# Patient Record
Sex: Male | Born: 1980 | Race: White | Hispanic: No | Marital: Married | Smoking: Never smoker
Health system: Southern US, Community
[De-identification: ages and names within clinical notes are randomized; demographics above are authoritative.]

## PROBLEM LIST (undated history)

## (undated) DIAGNOSIS — N200 Calculus of kidney: Secondary | ICD-10-CM

## (undated) DIAGNOSIS — K219 Gastro-esophageal reflux disease without esophagitis: Secondary | ICD-10-CM

## (undated) DIAGNOSIS — R55 Syncope and collapse: Secondary | ICD-10-CM

## (undated) DIAGNOSIS — M7580 Other shoulder lesions, unspecified shoulder: Secondary | ICD-10-CM

## (undated) HISTORY — DX: Other shoulder lesions, unspecified shoulder: M75.80

## (undated) HISTORY — DX: Syncope and collapse: R55

## (undated) HISTORY — DX: Calculus of kidney: N20.0

---
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
## 2001-03-29 ENCOUNTER — Encounter: Payer: Self-pay | Admitting: *Deleted

## 2001-03-29 ENCOUNTER — Observation Stay (HOSPITAL_COMMUNITY): Admission: EM | Admit: 2001-03-29 | Discharge: 2001-03-30 | Payer: Self-pay | Admitting: *Deleted

## 2006-12-30 ENCOUNTER — Emergency Department (HOSPITAL_COMMUNITY): Admission: EM | Admit: 2006-12-30 | Discharge: 2006-12-30 | Payer: Self-pay | Admitting: Emergency Medicine

## 2012-04-17 ENCOUNTER — Emergency Department (HOSPITAL_COMMUNITY): Payer: BC Managed Care – PPO

## 2012-04-17 ENCOUNTER — Emergency Department (HOSPITAL_COMMUNITY)
Admission: EM | Admit: 2012-04-17 | Discharge: 2012-04-17 | Disposition: A | Discharge status: Home / Self Care | Payer: BC Managed Care – PPO | Attending: Emergency Medicine | Admitting: Emergency Medicine

## 2012-04-17 ENCOUNTER — Encounter (HOSPITAL_COMMUNITY): Payer: Self-pay | Admitting: Emergency Medicine

## 2012-04-17 DIAGNOSIS — R1110 Vomiting, unspecified: Secondary | ICD-10-CM | POA: Insufficient documentation

## 2012-04-17 DIAGNOSIS — R109 Unspecified abdominal pain: Secondary | ICD-10-CM | POA: Insufficient documentation

## 2012-04-17 DIAGNOSIS — Z79899 Other long term (current) drug therapy: Secondary | ICD-10-CM | POA: Insufficient documentation

## 2012-04-17 DIAGNOSIS — K219 Gastro-esophageal reflux disease without esophagitis: Secondary | ICD-10-CM | POA: Insufficient documentation

## 2012-04-17 DIAGNOSIS — N23 Unspecified renal colic: Secondary | ICD-10-CM

## 2012-04-17 DIAGNOSIS — N1330 Unspecified hydronephrosis: Secondary | ICD-10-CM | POA: Insufficient documentation

## 2012-04-17 DIAGNOSIS — R197 Diarrhea, unspecified: Secondary | ICD-10-CM | POA: Insufficient documentation

## 2012-04-17 DIAGNOSIS — N201 Calculus of ureter: Secondary | ICD-10-CM | POA: Insufficient documentation

## 2012-04-17 HISTORY — DX: Gastro-esophageal reflux disease without esophagitis: K21.9

## 2012-04-17 LAB — COMPREHENSIVE METABOLIC PANEL
ALT: 18 U/L (ref 0–53)
AST: 22 U/L (ref 0–37)
Alkaline Phosphatase: 98 U/L (ref 39–117)
Calcium: 9.5 mg/dL (ref 8.4–10.5)
Potassium: 3.6 mEq/L (ref 3.5–5.1)
Sodium: 137 mEq/L (ref 135–145)
Total Protein: 7.2 g/dL (ref 6.0–8.3)

## 2012-04-17 LAB — URINALYSIS, ROUTINE W REFLEX MICROSCOPIC
Bilirubin Urine: NEGATIVE
Glucose, UA: NEGATIVE mg/dL
Nitrite: NEGATIVE
Protein, ur: NEGATIVE mg/dL
Specific Gravity, Urine: 1.026 (ref 1.005–1.030)
Urobilinogen, UA: 0.2 mg/dL (ref 0.0–1.0)
pH: 7.5 (ref 5.0–8.0)

## 2012-04-17 LAB — CBC
MCH: 30.4 pg (ref 26.0–34.0)
MCV: 85 fL (ref 78.0–100.0)
Platelets: 168 10*3/uL (ref 150–400)
RBC: 5 MIL/uL (ref 4.22–5.81)
RDW: 12.5 % (ref 11.5–15.5)
WBC: 5 10*3/uL (ref 4.0–10.5)

## 2012-04-17 LAB — DIFFERENTIAL
Basophils Absolute: 0 10*3/uL (ref 0.0–0.1)
Eosinophils Absolute: 0.1 10*3/uL (ref 0.0–0.7)
Eosinophils Relative: 2 % (ref 0–5)
Lymphocytes Relative: 30 % (ref 12–46)
Neutrophils Relative %: 62 % (ref 43–77)

## 2012-04-17 LAB — URINE MICROSCOPIC-ADD ON

## 2012-04-17 MED ORDER — ONDANSETRON HCL 4 MG PO TABS: 4.0000 mg | ORAL_TABLET | Freq: Four times a day (QID) | ORAL | Status: AC

## 2012-04-17 MED ORDER — SODIUM CHLORIDE 0.9 % IV BOLUS (SEPSIS)
1000.0000 mL | Freq: Once | INTRAVENOUS | Status: AC
  Administered 2012-04-17: 1000 mL via INTRAVENOUS

## 2012-04-17 MED ORDER — OXYCODONE-ACETAMINOPHEN 5-325 MG PO TABS: 1.0000 | ORAL_TABLET | ORAL | Status: AC | PRN

## 2012-04-17 MED ORDER — HYDROMORPHONE HCL PF 1 MG/ML IJ SOLN
1.0000 mg | Freq: Once | INTRAMUSCULAR | Status: AC
  Administered 2012-04-17: 1 mg via INTRAVENOUS
  Filled 2012-04-17: qty 1

## 2012-04-17 MED ORDER — ONDANSETRON HCL 4 MG/2ML IJ SOLN
4.0000 mg | Freq: Once | INTRAMUSCULAR | Status: AC
  Administered 2012-04-17: 4 mg via INTRAVENOUS
  Filled 2012-04-17: qty 2

## 2012-04-17 MED ORDER — CIPROFLOXACIN HCL 500 MG PO TABS: 500.0000 mg | ORAL_TABLET | Freq: Once | ORAL | Status: DC

## 2012-04-17 MED ORDER — CIPROFLOXACIN HCL 500 MG PO TABS: 500.0000 mg | ORAL_TABLET | Freq: Two times a day (BID) | ORAL | Status: AC

## 2012-04-17 NOTE — ED Provider Notes (Signed)
History     CSN: [id number]  Arrival date & time 04/17/12  1321   First MD Initiated Contact with Patient 04/17/12 1344      Chief Complaint  Patient presents with  . Flank Pain    (Consider location/radiation/quality/duration/timing/severity/associated sxs/prior treatment) HPI Patient complaining of pain rlq began about 1000 am today with sudden onset sharp pain with some radiation lower.  Pain is severe and constant.  Three episodes of loose stool and vomited x 3-4.  Some difficulty urinating, no pain or frequency.  No prior history of same.  Past Medical History  Diagnosis Date  . GERD (gastroesophageal reflux disease)     No past surgical history on file.  No family history on file.  History  Substance Use Topics  . Smoking status: Not on file  . Smokeless tobacco: Not on file  . Alcohol Use:       Review of Systems  All other systems reviewed and are negative.    Allergies  Review of patient's allergies indicates no known allergies.  Home Medications   Current Outpatient Rx  Name Route Sig Dispense Refill  . MELOXICAM 7.5 MG PO TABS Oral Take 7.5 mg by mouth daily.    Marland Kitchen OMEPRAZOLE 20 MG PO CPDR Oral Take 20 mg by mouth daily.      There were no vitals taken for this visit.  Physical Exam  Nursing note and vitals reviewed. Constitutional: He is oriented to person, place, and time. He appears well-developed and well-nourished.  HENT:  Head: Normocephalic and atraumatic.  Right Ear: External ear normal.  Left Ear: External ear normal.  Nose: Nose normal.  Mouth/Throat: Oropharynx is clear and moist.  Eyes: Conjunctivae and EOM are normal. Pupils are equal, round, and reactive to light.  Neck: Normal range of motion. Neck supple.  Cardiovascular: Normal rate, regular rhythm, normal heart sounds and intact distal pulses.   Pulmonary/Chest: Effort normal and breath sounds normal.  Abdominal: Soft. Bowel sounds are normal.  Musculoskeletal: Normal  range of motion.  Neurological: He is alert and oriented to person, place, and time. He has normal reflexes.  Skin: Skin is warm and dry.  Psychiatric: He has a normal mood and affect. His behavior is normal. Thought content normal.    ED Course  Procedures (including critical care time)  Labs Reviewed - No data to display No results found.   No diagnosis found.   Results for orders placed during the hospital encounter of 04/17/12  URINALYSIS, ROUTINE W REFLEX MICROSCOPIC      Component Value Range   Color, Urine YELLOW  YELLOW    APPearance CLEAR  CLEAR    Specific Gravity, Urine 1.026  1.005 - 1.030    pH 7.5  5.0 - 8.0    Glucose, UA NEGATIVE  NEGATIVE (mg/dL)   Hgb urine dipstick MODERATE (*) NEGATIVE    Bilirubin Urine NEGATIVE  NEGATIVE    Ketones, ur TRACE (*) NEGATIVE (mg/dL)   Protein, ur NEGATIVE  NEGATIVE (mg/dL)   Urobilinogen, UA 0.2  0.0 - 1.0 (mg/dL)   Nitrite NEGATIVE  NEGATIVE    Leukocytes, UA TRACE (*) NEGATIVE   CBC      Component Value Range   WBC 5.0  4.0 - 10.5 (K/uL)   RBC 5.00  4.22 - 5.81 (MIL/uL)   Hemoglobin 15.2  13.0 - 17.0 (g/dL)   HCT 40.9  81.1 - 91.4 (%)   MCV 85.0  78.0 - 100.0 (fL)   MCH  30.4  26.0 - 34.0 (pg)   MCHC 35.8  30.0 - 36.0 (g/dL)   RDW 16.1  09.6 - 04.5 (%)   Platelets 168  150 - 400 (K/uL)  DIFFERENTIAL      Component Value Range   Neutrophils Relative 62  43 - 77 (%)   Neutro Abs 3.1  1.7 - 7.7 (K/uL)   Lymphocytes Relative 30  12 - 46 (%)   Lymphs Abs 1.5  0.7 - 4.0 (K/uL)   Monocytes Relative 7  3 - 12 (%)   Monocytes Absolute 0.3  0.1 - 1.0 (K/uL)   Eosinophils Relative 2  0 - 5 (%)   Eosinophils Absolute 0.1  0.0 - 0.7 (K/uL)   Basophils Relative 0  0 - 1 (%)   Basophils Absolute 0.0  0.0 - 0.1 (K/uL)  COMPREHENSIVE METABOLIC PANEL      Component Value Range   Sodium 137  135 - 145 (mEq/L)   Potassium 3.6  3.5 - 5.1 (mEq/L)   Chloride 102  96 - 112 (mEq/L)   CO2 22  19 - 32 (mEq/L)   Glucose, Bld 140 (*)  70 - 99 (mg/dL)   BUN 21  6 - 23 (mg/dL)   Creatinine, Ser 4.09 (*) 0.50 - 1.35 (mg/dL)   Calcium 9.5  8.4 - 81.1 (mg/dL)   Total Protein 7.2  6.0 - 8.3 (g/dL)   Albumin 4.5  3.5 - 5.2 (g/dL)   AST 22  0 - 37 (U/L)   ALT 18  0 - 53 (U/L)   Alkaline Phosphatase 98  39 - 117 (U/L)   Total Bilirubin 0.7  0.3 - 1.2 (mg/dL)   GFR calc non Af Amer 64 (*) >90 (mL/min)   GFR calc Af Amer 74 (*) >90 (mL/min)  URINE MICROSCOPIC-ADD ON      Component Value Range   Squamous Epithelial / LPF FEW (*) RARE    WBC, UA 0-2  <3 (WBC/hpf)   RBC / HPF 7-10  <3 (RBC/hpf)   Bacteria, UA FEW (*) RARE    Ct Abdomen Pelvis Wo Contrast  04/17/2012  *RADIOLOGY REPORT*  Clinical Data: Right flank and right lower quadrant pain with nausea and diarrhea.  CT ABDOMEN AND PELVIS WITHOUT CONTRAST  Technique:  Multidetector CT imaging of the abdomen and pelvis was performed following the standard protocol without intravenous contrast.  Comparison: None.  Findings: Lung bases are clear.  Heart size normal.  No pericardial or pleural effusion.  Liver, gallbladder and adrenal glands are unremarkable.  Mild right hydronephrosis and periaortic stranding secondary to a 2 mm stone at the right ureteral vesicle junction.  Left kidney, spleen, pancreas, stomach and bowel are unremarkable.  No pathologically enlarged lymph nodes.  No free fluid.  No worrisome lytic or sclerotic lesions.  IMPRESSION: Mild right hydronephrosis secondary to a 2 mm right ureteral vesicle junction stone.  Original Report Authenticated By: Reyes Ivan, M.D.   MDM    Patient is pain-free after 1 mg Dilaudid. Urine is to be cultured. I've advised patient regarding symptoms of his ureteral stone. He does have 0-2 white blood cells and few bacteria. He was placed on Cipro. He is advised to have recheck of his renal function his creatinine is elevated at 1.43. He is advised to orally rehydrate. He is given 1 L normal saline here.     Hilario Quarry,  MD 04/17/12 [phone]

## 2012-04-17 NOTE — ED Notes (Signed)
Discussed discharged instructions.  Patient verbalized understanding.  IV removed from RAC.  Catheter intact.  IV site WNL.  Denies pain.  Ambulatory with mother and wife at bedside.  Refused cipro from ED.  Will get Cipro filled at pharmacy and begin treatment at home.  No other concerns at this time.  Barrie Lyme 3:43 PM 04/17/2012

## 2012-04-17 NOTE — ED Notes (Signed)
Returned from CT.

## 2012-04-17 NOTE — ED Notes (Signed)
Patient transported to CT 

## 2012-04-17 NOTE — ED Notes (Signed)
Per pt, started vomiting around 10am-right flank pain-dysuria

## 2012-04-17 NOTE — Discharge Instructions (Signed)
Ureteral Colic  Ureteral colic is spasm-like pain from the kidney or the ureter. This is often caused by a kidney stone. The pain is caused by the stone trying to get through the tubes that pass your pee.  HOME CARE    Drink enough fluids to keep your pee (urine) clear or pale yellow.   Strain all your pee. A strainer will be provided. Keep anything caught in the strainer and bring it to your doctor. The stone causing the pain may be very small.   Only take medicine as told by your doctor.   Follow up with your doctor as told.  GET HELP RIGHT AWAY IF:    Pain is not controlled with medicine.   Pain continues or gets worse.   The pain changes and there is chest or belly (abdominal) pain.   You pass out (faint).   You cannot pee.   You keep throwing up (vomiting).   You have a temperature by mouth above 102 F (38.9 C), not controlled by medicine.  MAKE SURE YOU:    Understand these instructions.   Will watch this condition.   Will get help right away if you are not doing well or get worse.  Document Released: 05/01/2008 Document Revised: 11/02/2011 Document Reviewed: 05/01/2008  [employer] Patient Information 2012 [employer].

## 2012-04-18 LAB — URINE CULTURE: Culture  Setup Time: 201305230147

## 2012-06-13 IMAGING — CT CT ABD-PELV W/O CM
1 series · 16 of 25 positions shown, 20 images · non-contrast
Comparison: None.

CLINICAL DATA: Right flank and right lower quadrant pain with
nausea and diarrhea.

CT ABDOMEN AND PELVIS WITHOUT CONTRAST
TECHNIQUE: Multidetector CT imaging of the abdomen and pelvis was
performed following the standard protocol without intravenous
contrast.

[Series 4: lung · axial · 0.75mm/px · z∈[+1336,+1446]mm · 16 of 25 slices shown, 20 images]
[im 2/25  soft-tissue]
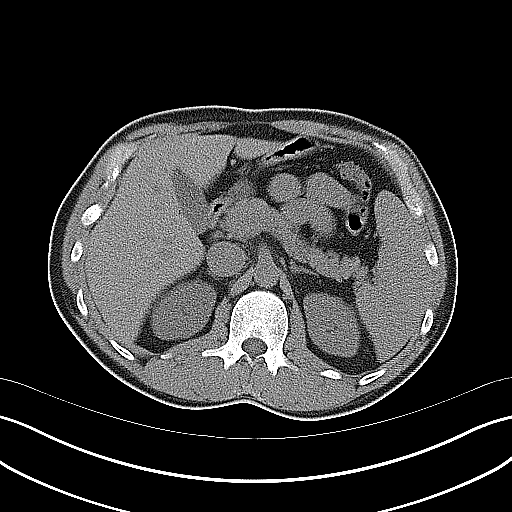
[im 2/25  bone]
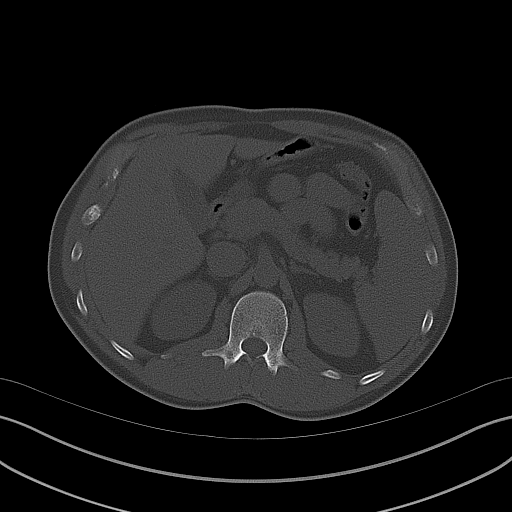
[im 4/25  soft-tissue]
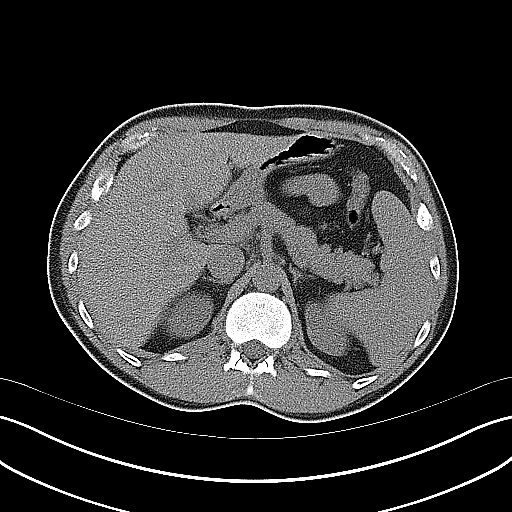
[im 6/25  soft-tissue]
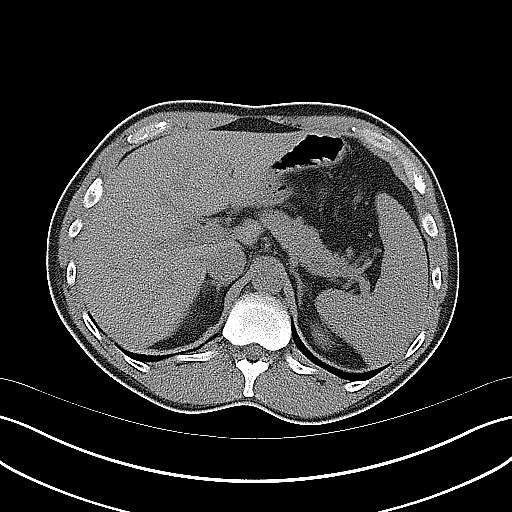
[im 7/25  soft-tissue]
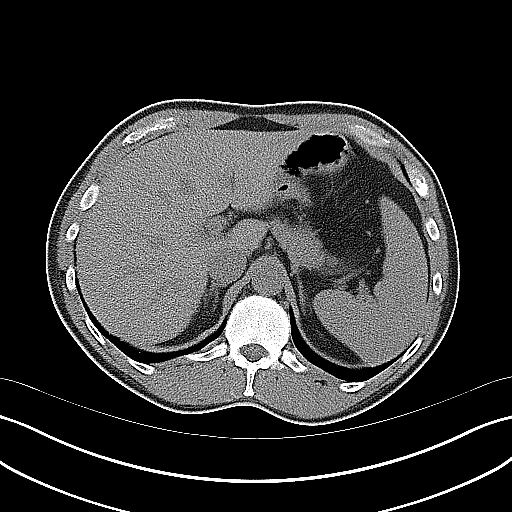
[im 9/25  soft-tissue]
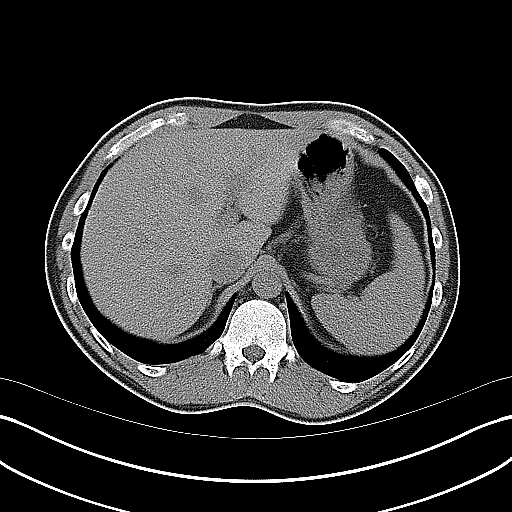
[im 11/25  soft-tissue]
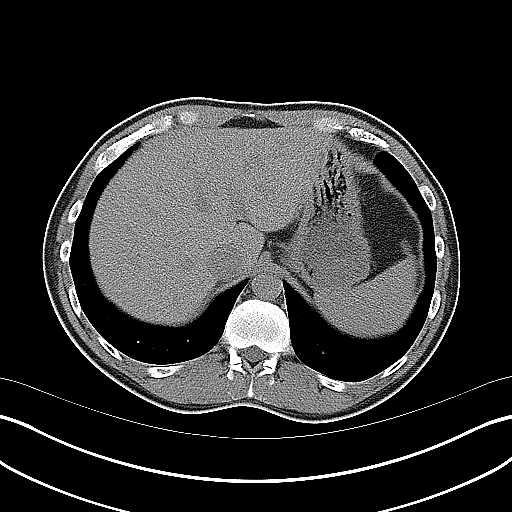
[im 12/25  soft-tissue]
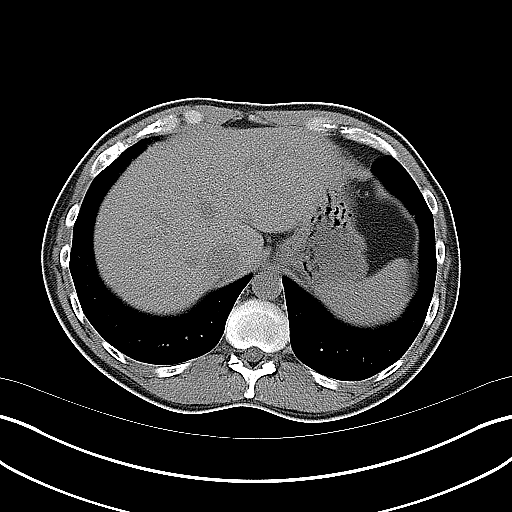
[im 14/25  soft-tissue]
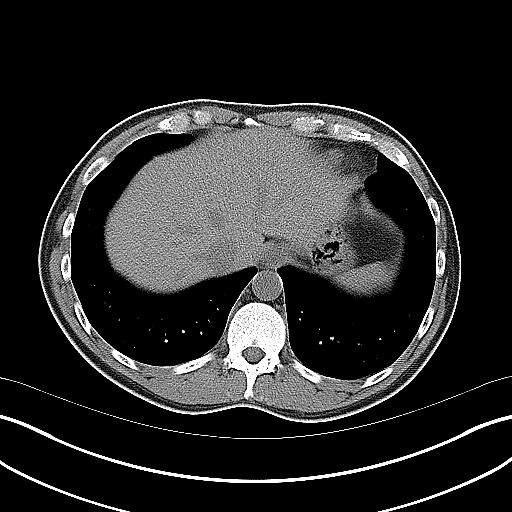
[im 15/25  soft-tissue]
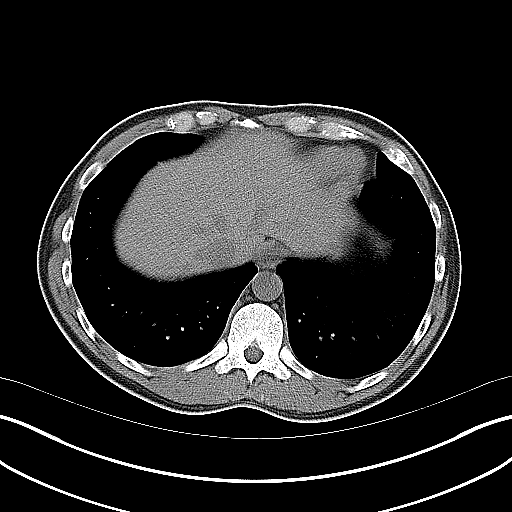
[im 15/25  bone]
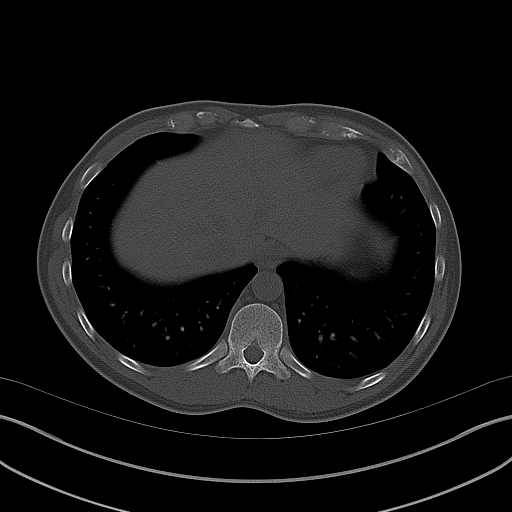
[im 17/25  soft-tissue]
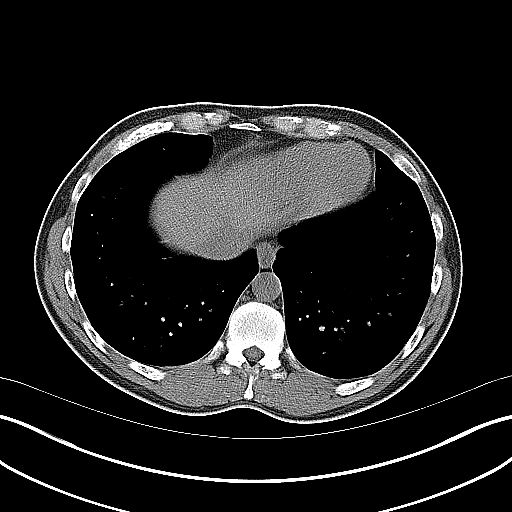
[im 19/25  soft-tissue]
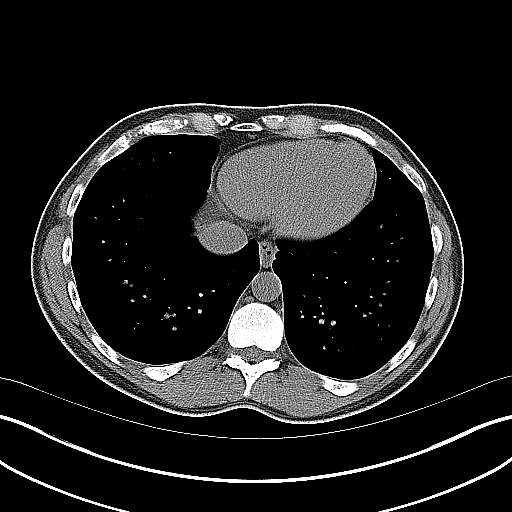
[im 20/25  soft-tissue]
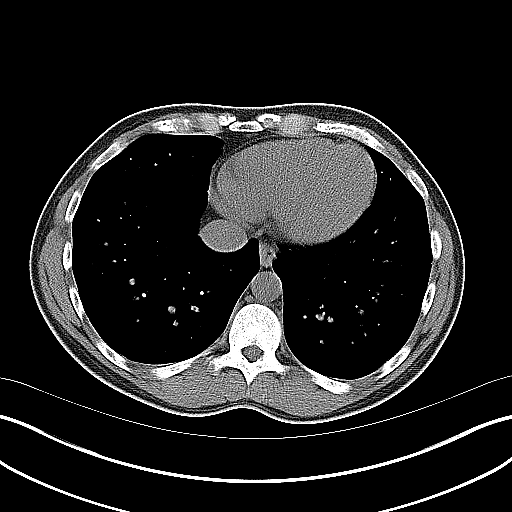
[im 21/25  lung]
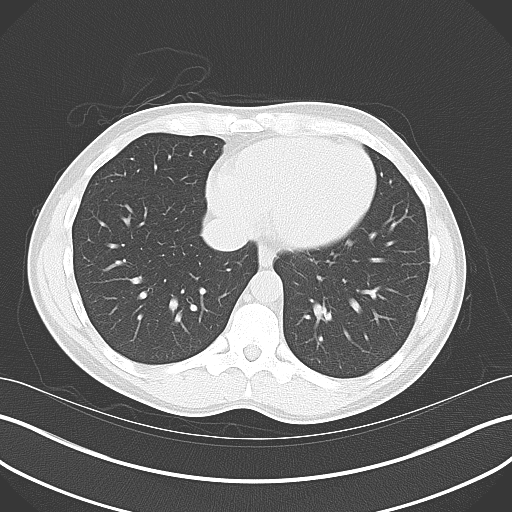
[im 22/25  soft-tissue]
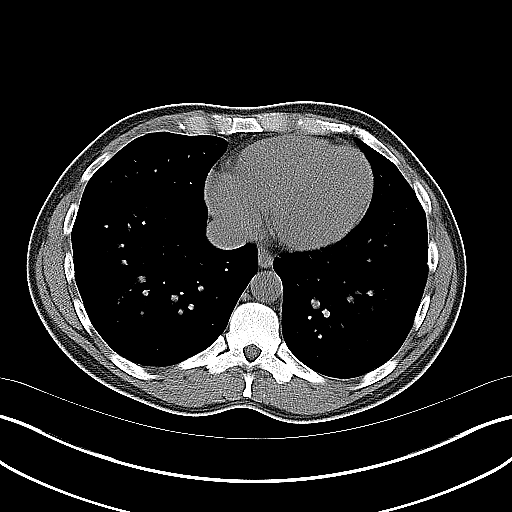
[im 22/25  lung]
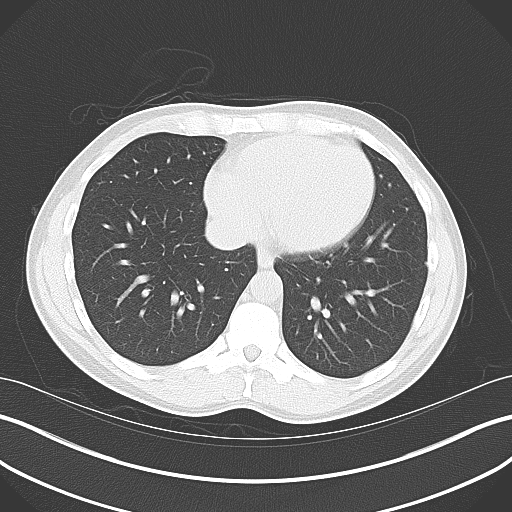
[im 23/25  lung]
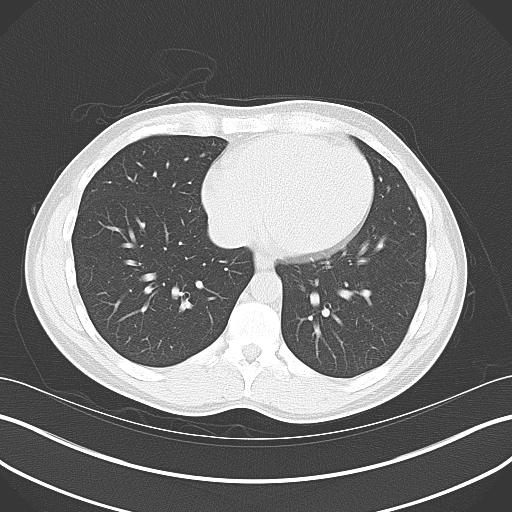
[im 24/25  soft-tissue]
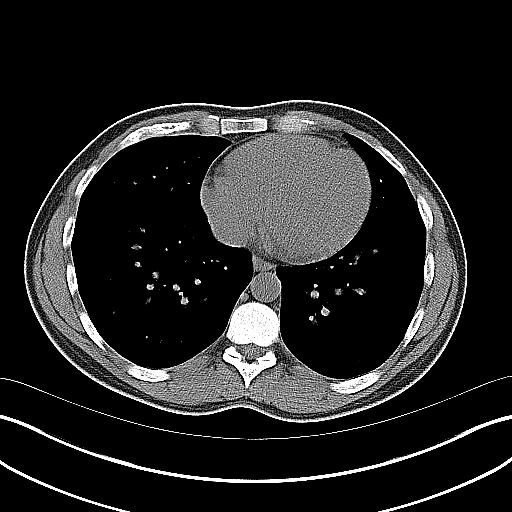
[im 24/25  lung]
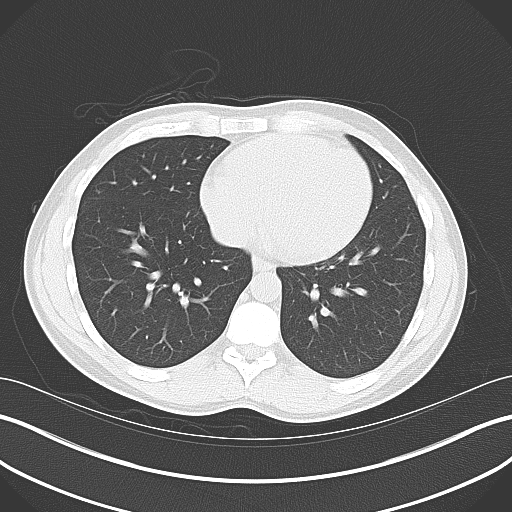

[16 of 25 positions shown; findings below may reference images not displayed]

FINDINGS: Lung bases are clear.  Heart size normal.  No pericardial
or pleural effusion.

Liver, gallbladder and adrenal glands are unremarkable.  Mild right
hydronephrosis and periaortic stranding secondary to a 2 mm stone
at the right ureteral vesicle junction.  Left kidney, spleen,
pancreas, stomach and bowel are unremarkable.  No pathologically
enlarged lymph nodes.  No free fluid.  No worrisome lytic or
sclerotic lesions.
IMPRESSION: Mild right hydronephrosis secondary to a 2 mm right ureteral
vesicle junction stone.

## 2014-08-13 ENCOUNTER — Encounter: Payer: Self-pay | Admitting: Family Medicine

## 2014-08-13 ENCOUNTER — Ambulatory Visit (INDEPENDENT_AMBULATORY_CARE_PROVIDER_SITE_OTHER): Payer: BC Managed Care – PPO | Admitting: Family Medicine

## 2014-08-13 VITALS — BP 120/80 | HR 67 | Temp 98.8°F | Resp 18 | Ht 69.0 in | Wt 173.0 lb

## 2014-08-13 DIAGNOSIS — M67919 Unspecified disorder of synovium and tendon, unspecified shoulder: Secondary | ICD-10-CM

## 2014-08-13 DIAGNOSIS — M7582 Other shoulder lesions, left shoulder: Secondary | ICD-10-CM

## 2014-08-13 DIAGNOSIS — M7580 Other shoulder lesions, unspecified shoulder: Secondary | ICD-10-CM | POA: Insufficient documentation

## 2014-08-13 DIAGNOSIS — M719 Bursopathy, unspecified: Secondary | ICD-10-CM

## 2014-08-13 MED ORDER — ESOMEPRAZOLE MAGNESIUM 20 MG PO CPDR: 20.0000 mg | DELAYED_RELEASE_CAPSULE | Freq: Every day | ORAL | Status: DC

## 2014-08-13 MED ORDER — MELOXICAM 15 MG PO TABS: ORAL_TABLET | ORAL | Status: DC

## 2014-08-13 NOTE — Progress Notes (Signed)
Pre visit review using our clinic review tool, if applicable. No additional management support is needed unless otherwise documented below in the visit note. 

## 2014-08-13 NOTE — Assessment & Plan Note (Addendum)
Recommended Mobic  qd x 14d, then 1 qd prn. Relative rest (he should continue to work--note written asking for him to have "light duty" x 1 wk), but avoid sports and activities at home that aggravate the shoulder). Exercises for rotator cuff injury were reviewed and handout given to pt today. If not significantly improving in 2 wks then he'll return for left subacromial steroid injection.

## 2014-08-13 NOTE — Progress Notes (Signed)
Office Note 08/13/2014  CC:  Chief Complaint  Patient presents with  . Establish Care    [facility]  . Shoulder Pain    Left sided    HPI:  Calvin Hood is a 33 y.o. White male who is here to establish care and discuss shoulder pain. Patient's most recent primary MD: Dr. Yehuda Budd. Old records were not reviewed prior to or during today's visit.  Hurting in left shoulder along anterior aspect: last couple of months but worse the last couple of weeks.  He recalls no strain or trauma.  At work he does repetitive work wit his arms--reaching, grabbing, lifting.  It does not hurt when resting /sitting still.  Certain movements of arm cause it to hurt, esp aBducting above level of shoulder.  NO significant clicking or popping.  No feeling of dislocation.  No paresthesias.  No weakness.  No neck pain.  The pain doesn't impair his sleep. No NSAIDs have been tried.  REcalls history of shoulder pain in the past (? L or R) that was treated with mobic and resolved pretty well as expected on this med.    Past Medical History  Diagnosis Date  . GERD (gastroesophageal reflux disease)   . Vasovagal syncope   . Nephrolithiasis     Past Surgical History  Procedure Laterality Date  . Inguinal hernia repair  1994    Left  . Left knee surgery  1987    Cyst removal    Family History  Problem Relation Age of Onset  . Heart attack Mother   . Throat cancer Father     +smoker (deceased)    History   Social History  . Marital Status: Single    Spouse Name: N/A    Number of Children: N/A  . Years of Education: N/A   Occupational History  . Not on file.   Social History Main Topics  . Smoking status: Never Smoker   . Smokeless tobacco: Never Used  . Alcohol Use: No  . Drug Use: No  . Sexual Activity: Not on file   Other Topics Concern  . Not on file   Social History Narrative   Married, 1 son.  Has two half brothers.   HS Grad.    Occupation: works for [employer] as a Merchandiser, retail.   No  T/A/Ds.   Exercise: basketball.         MEDS: zyrtec  qd, nexium  OTC qd  No Known Allergies  ROS Review of Systems  Constitutional: Negative for fever and fatigue.  HENT: Negative for congestion and sore throat.   Eyes: Negative for visual disturbance.  Respiratory: Negative for cough.   Cardiovascular: Negative for chest pain.  Gastrointestinal: Negative for nausea and abdominal pain.  Genitourinary: Negative for dysuria.  Musculoskeletal: Negative for back pain and joint swelling.  Skin: Negative for rash.  Neurological: Negative for weakness and headaches.  Hematological: Negative for adenopathy.    PE; Blood pressure 120/80, pulse 67, temperature 98.8 F (37.1 C), temperature source Temporal, resp. rate 18, height  (1.753 m), weight 173 lb (78.472 kg), SpO2 100.00%. Gen: Alert, well appearing.  Patient is oriented to person, place, time, and situation. ZOX:WRUE: no injection, icteris, swelling, or exudate.  EOMI, PERRLA. Mouth: lips without lesion/swelling.  Oral mucosa pink and moist. Oropharynx without erythema, exudate, or swelling.  Neck - No masses or thyromegaly or limitation in range of motion CV: RRR, no m/r/g.   LUNGS: CTA bilat, nonlabored resps, good aeration  in all lung fields.  Pertinent labs:  none  ASSESSMENT AND PLAN:   Rotator cuff tendonitis Recommended Mobic  qd x 14d, then 1 qd prn. Relative rest (he should continue to work--note written asking for him to have "light duty" x 1 wk), but avoid sports and activities at home that aggravate the shoulder). Exercises for rotator cuff injury were reviewed and handout given to pt today. If not significantly improving in 2 wks then he'll return for left subacromial steroid injection.  An After Visit Summary was printed and given to the patient.  Pt declined flu vaccine today.  Return if symptoms worsen or fail to improve.

## 2014-09-17 ENCOUNTER — Telehealth: Payer: Self-pay | Admitting: *Deleted

## 2014-09-17 NOTE — Telephone Encounter (Signed)
Patient's wife called office wanting to get an appt for pt to have injection in both shoulders. Will call Tiffany back with appt info.

## 2014-09-21 NOTE — Telephone Encounter (Signed)
Patient's wife stated that she will cb for appt with Dr. Milinda CaveMcGowen.

## 2014-09-23 ENCOUNTER — Ambulatory Visit (INDEPENDENT_AMBULATORY_CARE_PROVIDER_SITE_OTHER): Payer: BC Managed Care – PPO | Admitting: Family Medicine

## 2014-09-23 ENCOUNTER — Encounter: Payer: Self-pay | Admitting: Family Medicine

## 2014-09-23 VITALS — BP 118/70 | HR 72 | Temp 98.2°F | Resp 18 | Ht 69.0 in | Wt 178.0 lb

## 2014-09-23 DIAGNOSIS — M7582 Other shoulder lesions, left shoulder: Secondary | ICD-10-CM

## 2014-09-23 MED ORDER — MELOXICAM 15 MG PO TABS: ORAL_TABLET | ORAL | Status: DC

## 2014-09-23 NOTE — Progress Notes (Signed)
OFFICE NOTE  09/23/2014  CC:  Chief Complaint  Patient presents with  . Follow-up   HPI: Patient is a 33 y.o. Caucasian male who is here for shoulder pain f/u. About 6 wks ago I treated him conservatively for left rotator cuff tendonitis.   He stopped overhead reaching at work x 1 wk after I saw him and took mobic and things felt better. Then he went back to regular duty--reaching overhead repetitively and lifting heavy bags/boxes. Ran out of mobic 4d/a.  Since going back to normal work habits, his left shoulder hurts again (maybe a little worse than before), and his right shoulder has begun to have same sx's 2/10 intensity.  Reaching up above head hurts on left and lifting up a heavy bag from the floor hurts left shoulder.  No neck pain, no arm paresthesias. He is still productive at work.  His shoulder pain does not inhibit sleep.  Tries to sleep on back more. Doesn't hurt when playing playing basketball.  Pertinent PMH:  Past medical, surgical, social, and family history reviewed and no changes are noted since last office visit.  MEDS:  Outpatient Prescriptions Prior to Visit  Medication Sig Dispense Refill  . esomeprazole (NEXIUM 24HR) 20 MG capsule Take 1 capsule (20 mg total) by mouth daily at 12 noon.  30 capsule  0  . cetirizine (ZYRTEC) 10 MG tablet Take 10 mg by mouth daily.      . meloxicam (MOBIC) 15 MG tablet 1 tab po qd x 14d, then 1 tab po qd prn pain  30 tablet  1   No facility-administered medications prior to visit.    PE: Blood pressure 118/70, pulse 72, temperature 98.2 F (36.8 C), temperature source Oral, resp. rate 18, height 5\' 9"  (1.753 m), weight 178 lb (80.74 kg), SpO2 99.00%. Gen: Alert, well appearing.  Patient is oriented to person, place, time, and situation. AFFECT: pleasant, lucid thought and speech. Shoulder exam - bilateral normal; full range of motion, no pain on motion except at about 80 deg to 180 deg of aBduction on left--mild, no tenderness  or deformity noted.  Neg drop sign.  Neg empty can sign. No AC joint tenderness or deformity.  Neck ROM full, nontender in neck and upper back and traps. Grip strength 5/5 bilat.  Trace biceps/triceps/brachio DTRs bilat.  ER/IR against resistance causes no pain.    IMPRESSION AND PLAN:  Mild, intermittent rotator cuff tendonitis pain, L>>R. No injection indicated. He is improved with conservative therapies, which we reviewed again today and he'll continue: add theraband for home light resistance shoulder exercises.  Ice prn.  Mobic rx RF'd: use qd prn.  An After Visit Summary was printed and given to the patient.  FOLLOW UP: prn

## 2014-09-23 NOTE — Progress Notes (Signed)
Pre visit review using our clinic review tool, if applicable. No additional management support is needed unless otherwise documented below in the visit note. 

## 2015-10-05 ENCOUNTER — Encounter: Payer: Self-pay | Admitting: Family Medicine

## 2015-10-05 ENCOUNTER — Ambulatory Visit (INDEPENDENT_AMBULATORY_CARE_PROVIDER_SITE_OTHER): Payer: BLUE CROSS/BLUE SHIELD | Admitting: Family Medicine

## 2015-10-05 VITALS — BP 106/73 | HR 86 | Temp 98.2°F | Resp 20 | Wt 179.5 lb

## 2015-10-05 DIAGNOSIS — J0100 Acute maxillary sinusitis, unspecified: Secondary | ICD-10-CM | POA: Diagnosis not present

## 2015-10-05 DIAGNOSIS — J320 Chronic maxillary sinusitis: Secondary | ICD-10-CM | POA: Insufficient documentation

## 2015-10-05 MED ORDER — FLUTICASONE PROPIONATE 50 MCG/ACT NA SUSP
2.0000 | Freq: Every day | NASAL | Status: DC
Start: 2015-10-05 — End: 2016-06-15

## 2015-10-05 MED ORDER — AMOXICILLIN-POT CLAVULANATE 875-125 MG PO TABS: 1.0000 | ORAL_TABLET | Freq: Two times a day (BID) | ORAL | Status: DC

## 2015-10-05 NOTE — Patient Instructions (Signed)

## 2015-10-05 NOTE — Progress Notes (Signed)
   Subjective:    Patient ID: Calvin Hood, male    DOB: 1980/12/29, 34 y.o.   MRN: [id number]  HPI  Sinus pressure: Pt presents for a 1 day h/o of sinus pressure maxillary and frontal, headache, throat discomfort and congestion. Pr denies fever, chills, rash, nausea, vomit, diarrhea or exposure to sick contacts.Patient states he gets a sinus infection about this time every year. He is tolerating PO, appetite is  Normal. He does not take an antihistamine daily. He has not tried anything for current issues.   Nonsmoker  Past Medical History  Diagnosis Date  . GERD (gastroesophageal reflux disease)   . Vasovagal syncope   . Nephrolithiasis   . Rotator cuff tendonitis     left    No Known Allergies  Social History   Social History  . Marital Status: Single    Spouse Name: N/A  . Number of Children: N/A  . Years of Education: N/A   Occupational History  . Not on file.   Social History Main Topics  . Smoking status: Never Smoker   . Smokeless tobacco: Never Used  . Alcohol Use: No  . Drug Use: No  . Sexual Activity: Not on file   Other Topics Concern  . Not on file   Social History Narrative   Married, 1 son.  Has two half brothers.   HS Grad.    Occupation: works for [employer] as a Merchandiser, retailbagger.   No T/A/Ds.   Exercise: basketball.         Review of Systems Negative, with the exception of above mentioned in HPI     Objective:   Physical Exam BP 106/73 mmHg  Pulse 86  Temp(Src) 98.2 F (36.8 C) (Oral)  Resp 20  Wt 179 lb 8 oz (81.421 kg)  SpO2 97% Gen: Afebrile. No acute distress. Nontoxic in appearance, fatigued looking. Caucasian male.  HENT: AT. [place]. Bilateral TM visualized, no erythema or bulging, mild pink tone to left TM.  MMM. Bilateral nares with moderate erythema and swelling. Throat without erythema or exudates. No cough or hoarseness on exam. No TTP sinus cavities of face.  Eyes:Pupils Equal Round Reactive to light, Extraocular movements intact,  Conjunctiva  without redness, discharge or icterus. Neck/lymp/endocrine: Supple,no lymphadenopathy CV: RRR  Chest: CTAB, no wheeze or crackles Abd: Soft. NTND. BS present Skin: no rashes, purpura or petechiae.      Assessment & Plan:  1. Acute maxillary sinusitis, recurrence not specified - Likely viral in nature. Discussed bacterial vs viral with pt today. - Script given if no improvement in 4 days.  - start Flonase, nasal saline, advil/tylenol, rest and hydration.  - work excuse provided for today - fluticasone (FLONASE) 50 MCG/ACT nasal spray; Place 2 sprays into both nostrils daily.  Dispense: 16 g; Refill: 6 - amoxicillin-clavulanate (AUGMENTIN) 875-125 MG tablet; Take 1 tablet by mouth 2 (two) times daily.  Dispense: 20 tablet; Refill: 0 - f/u  PRN

## 2016-05-02 ENCOUNTER — Ambulatory Visit (INDEPENDENT_AMBULATORY_CARE_PROVIDER_SITE_OTHER): Payer: BLUE CROSS/BLUE SHIELD | Admitting: Family Medicine

## 2016-05-02 ENCOUNTER — Encounter: Payer: Self-pay | Admitting: Family Medicine

## 2016-05-02 VITALS — BP 107/72 | HR 75 | Temp 98.1°F | Resp 20 | Ht 69.0 in | Wt 177.8 lb

## 2016-05-02 DIAGNOSIS — J0100 Acute maxillary sinusitis, unspecified: Secondary | ICD-10-CM

## 2016-05-02 MED ORDER — AMOXICILLIN-POT CLAVULANATE 875-125 MG PO TABS: 1.0000 | ORAL_TABLET | Freq: Two times a day (BID) | ORAL | Status: DC

## 2016-05-02 NOTE — Progress Notes (Signed)
   Subjective:    Patient ID: Caryl NeverJason Zukowski, male    DOB: 30-Aug-1981, 35 y.o.   MRN: [id number]  HPI  Sinus pressure: Pt presents for a 4 day h/o of sinus pressure frontal, headache, throat discomfort and congestion. Pt denies fever, chills, rash, nausea, vomit, diarrhea or exposure to sick contacts. He is tolerating PO, appetite is  Normal. He does not take an antihistamine daily. He has tried starting flonase. Nonsmoker  Past Medical History  Diagnosis Date  . GERD (gastroesophageal reflux disease)   . Vasovagal syncope   . Nephrolithiasis   . Rotator cuff tendonitis     left    No Known Allergies  Social History   Social History  . Marital Status: Single    Spouse Name: N/A  . Number of Children: N/A  . Years of Education: N/A   Occupational History  . Not on file.   Social History Main Topics  . Smoking status: Never Smoker   . Smokeless tobacco: Never Used  . Alcohol Use: No  . Drug Use: No  . Sexual Activity: Not on file   Other Topics Concern  . Not on file   Social History Narrative   Married, 1 son.  Has two half brothers.   HS Grad.    Occupation: works for [employer] as a Merchandiser, retailbagger.   No T/A/Ds.   Exercise: basketball.         Review of Systems Negative, with the exception of above mentioned in HPI     Objective:   Physical Exam BP 107/72 mmHg  Pulse 75  Temp(Src) 98.1 F (36.7 C)  Resp 20  Ht 5\' 9"  (1.753 m)  Wt 177 lb 12.8 oz (80.65 kg)  BMI 26.24 kg/m2  SpO2 97% Gen: Afebrile. No acute distress. Nontoxic in appearance, fatigued looking. Caucasian male.  HENT: AT. [place]. Bilateral TM visualized, no erythema or bulging, mild pink tone to right  TM.  MMM. Bilateral nares with severe erythema, swelling and drainage. Throat without erythema or exudates. Mild cough, no hoarseness on exam. TTP sinus cavities of face.  Eyes:Pupils Equal Round Reactive to light, Extraocular movements intact,  Conjunctiva without redness, discharge or  icterus. Neck/lymp/endocrine: Supple, mild ant cervical  lymphadenopathy CV: RRR  Chest: CTAB, no wheeze or crackles Abd: Soft. NTND. BS present Skin: no rashes, purpura or petechiae.      Assessment & Plan:  1. Acute maxillary sinusitis, recurrence not specified  - start Flonase, nasal saline, advil/tylenol, rest and hydration.  - fluticasone (FLONASE) 50 MCG/ACT nasal spray; Place 2 sprays into both - amoxicillin-clavulanate (AUGMENTIN) 875-125 MG tablet; Take 1 tablet by mouth 2 (two) times daily.  Dispense: 20 tablet; Refill: 0 - f/u  PRN  Electronically Signed by: Felix Pacinienee Kenzel Ruesch, DO [facility] [place]

## 2016-05-02 NOTE — Patient Instructions (Signed)
- start Flonase, nasal saline, advil/tylenol, rest and hydration.  - fluticasone (daily, mucinex for cough and nightime - amoxicillin-clavulanate called into your pharmacy. Take until all medicine is completed.   Sinusitis, Adult Sinusitis is redness, soreness, and inflammation of the paranasal sinuses. Paranasal sinuses are air pockets within the bones of your face. They are located beneath your eyes, in the middle of your forehead, and above your eyes. In healthy paranasal sinuses, mucus is able to drain out, and air is able to circulate through them by way of your nose. However, when your paranasal sinuses are inflamed, mucus and air can become trapped. This can allow bacteria and other germs to grow and cause infection. Sinusitis can develop quickly and last only a short time (acute) or continue over a long period (chronic). Sinusitis that lasts for more than 12 weeks is considered chronic. CAUSES Causes of sinusitis include:  Allergies.  Structural abnormalities, such as displacement of the cartilage that separates your nostrils (deviated septum), which can decrease the air flow through your nose and sinuses and affect sinus drainage.  Functional abnormalities, such as when the small hairs (cilia) that line your sinuses and help remove mucus do not work properly or are not present. SIGNS AND SYMPTOMS Symptoms of acute and chronic sinusitis are the same. The primary symptoms are pain and pressure around the affected sinuses. Other symptoms include:  Upper toothache.  Earache.  Headache.  Bad breath.  Decreased sense of smell and taste.  A cough, which worsens when you are lying flat.  Fatigue.  Fever.  Thick drainage from your nose, which often is green and may contain pus (purulent).  Swelling and warmth over the affected sinuses. DIAGNOSIS Your health care provider will perform a physical exam. During your exam, your health care provider may perform any of the following  to help determine if you have acute sinusitis or chronic sinusitis:  Look in your nose for signs of abnormal growths in your nostrils (nasal polyps).  Tap over the affected sinus to check for signs of infection.  View the inside of your sinuses using an imaging device that has a light attached (endoscope). If your health care provider suspects that you have chronic sinusitis, one or more of the following tests may be recommended:  Allergy tests.  Nasal culture. A sample of mucus is taken from your nose, sent to a lab, and screened for bacteria.  Nasal cytology. A sample of mucus is taken from your nose and examined by your health care provider to determine if your sinusitis is related to an allergy. TREATMENT Most cases of acute sinusitis are related to a viral infection and will resolve on their own within 10 days. Sometimes, medicines are prescribed to help relieve symptoms of both acute and chronic sinusitis. These may include pain medicines, decongestants, nasal steroid sprays, or saline sprays. However, for sinusitis related to a bacterial infection, your health care provider will prescribe antibiotic medicines. These are medicines that will help kill the bacteria causing the infection. Rarely, sinusitis is caused by a fungal infection. In these cases, your health care provider will prescribe antifungal medicine. For some cases of chronic sinusitis, surgery is needed. Generally, these are cases in which sinusitis recurs more than 3 times per year, despite other treatments. HOME CARE INSTRUCTIONS  Drink plenty of water. Water helps thin the mucus so your sinuses can drain more easily.  Use a humidifier.  Inhale steam 3-4 times a day (for example, sit in the bathroom  with the shower running).  Apply a warm, moist washcloth to your face 3-4 times a day, or as directed by your health care provider.  Use saline nasal sprays to help moisten and clean your sinuses.  Take medicines only  as directed by your health care provider.  If you were prescribed either an antibiotic or antifungal medicine, finish it all even if you start to feel better. SEEK IMMEDIATE MEDICAL CARE IF:  You have increasing pain or severe headaches.  You have nausea, vomiting, or drowsiness.  You have swelling around your face.  You have vision problems.  You have a stiff neck.  You have difficulty breathing.   This information is not intended to replace advice given to you by your health care provider. Make sure you discuss any questions you have with your health care provider.   Document Released: 11/13/2005 Document Revised: 12/04/2014 Document Reviewed: 11/28/2011 Elsevier Interactive Patient Education [employer]

## 2016-06-15 ENCOUNTER — Encounter: Payer: Self-pay | Admitting: Family Medicine

## 2016-06-15 ENCOUNTER — Ambulatory Visit (INDEPENDENT_AMBULATORY_CARE_PROVIDER_SITE_OTHER): Payer: BLUE CROSS/BLUE SHIELD | Admitting: Family Medicine

## 2016-06-15 VITALS — BP 117/82 | HR 55 | Temp 97.7°F | Resp 16 | Ht 69.0 in | Wt 177.2 lb

## 2016-06-15 DIAGNOSIS — L853 Xerosis cutis: Secondary | ICD-10-CM

## 2016-06-15 NOTE — Patient Instructions (Signed)
Apply a small amount of otc moisturizing cream called lubriderm once daily.  You may also apply otc generic lamisil cream as directed on the packaging.  Try to keep the area dry the best you can.

## 2016-06-15 NOTE — Progress Notes (Signed)
Pre visit review using our clinic review tool, if applicable. No additional management support is needed unless otherwise documented below in the visit note. 

## 2016-06-15 NOTE — Progress Notes (Signed)
OFFICE VISIT  06/15/2016   CC:  Chief Complaint  Patient presents with  . Dry skin on privates    HPI:    Patient is a 35 y.o. Caucasian male who presents for flaky and dry skin in private area last few days. Slightly itchy.  Nothing has been applied.  Says it is improved somewhat today compared to a couple days ago.  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  Past Medical History  Diagnosis Date  . GERD (gastroesophageal reflux disease)   . Vasovagal syncope   . Nephrolithiasis   . Rotator cuff tendonitis     left    Past Surgical History  Procedure Laterality Date  . Inguinal hernia repair  1994    Left  . Left knee surgery  1987    Cyst removal    MEDS: none  No Known Allergies  ROS As per HPI  PE: Blood pressure 117/82, pulse 55, temperature 97.7 F (36.5 C), temperature source Oral, resp. rate 16, height 5\' 9"  (1.753 m), weight 177 lb 4 oz (80.4 kg), SpO2 100 %. Gen: Alert, well appearing.  Patient is oriented to person, place, time, and situation. AFFECT: pleasant, lucid thought and speech. GU region: no rash.  On the shaft of the penis near the glans he has a bit of superficial skin peeling. No erythema.  No vesicles or other focal lesions.  LABS:  none  IMPRESSION AND PLAN:  Dry skin on penis: I think this is coming from the excessive sweating/moisture when he works, causes a bit of superficial desquamation.  Then this has the appearance of dry/peeling skin after all the sweat is gone.  No sign of tinea but this is certainly what he is worried about. We discussed possible treatments and decided on the following instructions:   Apply a small amount of otc moisturizing cream called lubriderm once daily.  You may also apply otc generic lamisil cream as directed on the packaging.  Try to keep the area dry the best you can.  FOLLOW UP: Return if symptoms worsen or fail to improve.  Signed:  Santiago BumpersPhil Shreyas Piatkowski, MD            06/15/2016

## 2016-08-17 ENCOUNTER — Ambulatory Visit (INDEPENDENT_AMBULATORY_CARE_PROVIDER_SITE_OTHER): Payer: BLUE CROSS/BLUE SHIELD | Admitting: Family Medicine

## 2016-08-17 ENCOUNTER — Encounter: Payer: Self-pay | Admitting: Family Medicine

## 2016-08-17 VITALS — BP 106/77 | HR 55 | Temp 97.7°F | Resp 18 | Wt 170.0 lb

## 2016-08-17 DIAGNOSIS — K529 Noninfective gastroenteritis and colitis, unspecified: Secondary | ICD-10-CM | POA: Diagnosis not present

## 2016-08-17 MED ORDER — PROMETHAZINE HCL 12.5 MG PO TABS: ORAL_TABLET | ORAL | 0 refills | Status: DC

## 2016-08-17 NOTE — Progress Notes (Signed)
OFFICE VISIT  08/17/2016   CC:  Chief Complaint  Patient presents with  . Emesis    nausea, some diarrhea    HPI:    Patient is a 35 y.o. Caucasian male who presents accompanied by his mother for gastrointestinal symptoms. Vomited soon after eating BF this morning.  Nauseated rest of day today, some dry heaves.  He is able to drink fluids fine. Also has had some diarrhea--onset 4-5 d/a, 3-5 loose stools per day, some mostly water.  No blood in stool. No fever.  No body aches.  Slight HA lately. No anti nauea or antidiarrheal.  No known sick contacts.  No recent travel or camping.  No abd pains.  Past Medical History:  Diagnosis Date  . GERD (gastroesophageal reflux disease)   . Nephrolithiasis   . Rotator cuff tendonitis    left  . Vasovagal syncope     Past Surgical History:  Procedure Laterality Date  . INGUINAL HERNIA REPAIR  1994   Left  . left knee surgery  1987   Cyst removal    No outpatient prescriptions prior to visit.   No facility-administered medications prior to visit.     No Known Allergies  ROS As per HPI  PE: Blood pressure 106/77, pulse (!) 55, temperature 97.7 F (36.5 C), resp. rate 18, weight 170 lb (77.1 kg), SpO2 99 %. Gen: Alert, well appearing.  Patient is oriented to person, place, time, and situation. EAV:WUJWENT:Eyes: no injection, icteris, swelling, or exudate.  EOMI, PERRLA. Mouth: lips without lesion/swelling.  Oral mucosa pink and moist. Oropharynx without erythema, exudate, or swelling.  Neck - No masses or thyromegaly or limitation in range of motion CV: RRR, no m/r/g.   LUNGS: CTA bilat, nonlabored resps, good aeration in all lung fields. ABD: soft, NT, ND, BS normal.  No hepatospenomegaly or mass.  No bruits. EXT: no clubbing, cyanosis, or edema.   LABS:  none  IMPRESSION AND PLAN:  Acute gastroenteritis, suspect viral etiology. He has some excessive work stress that may be contributing to some GI upset/diarrhea as  well. Recommended otc imodium prn. Rx'd phenergan 12.5mg , 1-2 tabs q6h prn, #30, no RF. Out of work today and tomorrow.  An After Visit Summary was printed and given to the patient.  FOLLOW UP: Return if symptoms worsen or fail to improve.  Signed:  Santiago BumpersPhil Latyra Jaye, MD           08/17/2016

## 2016-08-17 NOTE — Progress Notes (Signed)
Pre visit review using our clinic review tool, if applicable. No additional management support is needed unless otherwise documented below in the visit note. 

## 2016-08-17 NOTE — Progress Notes (Signed)
A user error has taken place: encounter opened in error, closed for administrative reasons.

## 2016-08-17 NOTE — Patient Instructions (Signed)
For diarrhea, try otc generic imodium: follow instructions on packaging.

## 2016-11-01 ENCOUNTER — Ambulatory Visit (INDEPENDENT_AMBULATORY_CARE_PROVIDER_SITE_OTHER): Payer: BLUE CROSS/BLUE SHIELD | Admitting: Family Medicine

## 2016-11-01 ENCOUNTER — Encounter: Payer: Self-pay | Admitting: Family Medicine

## 2016-11-01 VITALS — BP 113/71 | HR 79 | Temp 99.0°F | Resp 16 | Ht 69.0 in | Wt 181.2 lb

## 2016-11-01 DIAGNOSIS — J069 Acute upper respiratory infection, unspecified: Secondary | ICD-10-CM

## 2016-11-01 DIAGNOSIS — B9789 Other viral agents as the cause of diseases classified elsewhere: Secondary | ICD-10-CM

## 2016-11-01 NOTE — Progress Notes (Signed)
Pre visit review using our clinic review tool, if applicable. No additional management support is needed unless otherwise documented below in the visit note. 

## 2016-11-01 NOTE — Patient Instructions (Signed)
Get otc generic robitussin DM OR Mucinex DM and use as directed on the packaging for cough and congestion. Use otc generic saline nasal spray 2-3 times per day to irrigate/moisturize your nasal passages.   

## 2016-11-01 NOTE — Progress Notes (Signed)
OFFICE VISIT  11/01/2016   CC:  Chief Complaint  Patient presents with  . Sinusitis    x 2-3 days     HPI:    Patient is a 35 y.o. Caucasian male who presents for resp symptoms. Onset 3 d/a nasal congestion, alt with runny nose, slight cough, dry/raspy throat, HA in peri-orbital area. No fever.  Took flonase last couple days.  No wheezing or SOB.  Past Medical History:  Diagnosis Date  . GERD (gastroesophageal reflux disease)   . Nephrolithiasis   . Rotator cuff tendonitis    left  . Vasovagal syncope     Past Surgical History:  Procedure Laterality Date  . INGUINAL HERNIA REPAIR  1994   Left  . left knee surgery  1987   Cyst removal    Outpatient Medications Prior to Visit  Medication Sig Dispense Refill  . promethazine (PHENERGAN) 12.5 MG tablet 1-2 tabs po q6h prn nausea (Patient not taking: Reported on 11/01/2016) 30 tablet 0   No facility-administered medications prior to visit.     No Known Allergies  ROS As per HPI  PE: Blood pressure 113/71, pulse 79, temperature 99 F (37.2 C), temperature source Oral, resp. rate 16, height 5\' 9"  (1.753 m), weight 181 lb 4 oz (82.2 kg), SpO2 99 %. VS: noted--normal. Gen: alert, NAD, NONTOXIC APPEARING. HEENT: eyes without injection, drainage, or swelling.  Ears: EACs clear, TMs with normal light reflex and landmarks.  Nose: White rhinorrhea, with some dried, crusty exudate adherent to mildly injected mucosa.  No purulent d/c.  No paranasal sinus TTP.  No facial swelling.  Throat and mouth without focal lesion.  No pharyngial swelling, erythema, or exudate.   Neck: supple, no LAD.   LUNGS: CTA bilat, nonlabored resps.   CV: RRR, no m/r/g. EXT: no c/c/e SKIN: no rash  LABS:  none  IMPRESSION AND PLAN:  Viral URI with cough. Get otc generic robitussin DM OR Mucinex DM and use as directed on the packaging for cough and congestion. Use otc generic saline nasal spray 2-3 times per day to irrigate/moisturize your  nasal passages. Continue flonase qd.  An After Visit Summary was printed and given to the patient.  FOLLOW UP: Return if symptoms worsen or fail to improve.  Signed:  Santiago BumpersPhil Joselynne Killam, MD           11/01/2016

## 2017-01-08 ENCOUNTER — Telehealth: Payer: Self-pay | Admitting: Family Medicine

## 2017-01-08 NOTE — Telephone Encounter (Signed)
Ok with me 

## 2017-01-08 NOTE — Telephone Encounter (Signed)
Ok with me if patient wants to transfer and ok with current PCP

## 2017-01-08 NOTE — Telephone Encounter (Signed)
Patients wife called in to request that patient transfer care [facility]. She feels that there would be more of a therapeutic relationship with Selena [facility]attenCody, given the patient's conditions.   Selena BattenC[facility], is this ok with you?   Dr. Milinda CaveMcGowen, is this ok with you?

## 2017-01-08 NOTE — Telephone Encounter (Signed)
Left VM that transfer is okay & to CB to schedule an appt

## 2017-01-22 ENCOUNTER — Encounter: Payer: Self-pay | Admitting: Physician Assistant

## 2017-01-22 ENCOUNTER — Ambulatory Visit (INDEPENDENT_AMBULATORY_CARE_PROVIDER_SITE_OTHER): Payer: BLUE CROSS/BLUE SHIELD | Admitting: Physician Assistant

## 2017-01-22 VITALS — BP 110/72 | HR 100 | Temp 97.7°F | Resp 14 | Ht 70.0 in | Wt 177.0 lb

## 2017-01-22 DIAGNOSIS — J329 Chronic sinusitis, unspecified: Secondary | ICD-10-CM

## 2017-01-22 DIAGNOSIS — R55 Syncope and collapse: Secondary | ICD-10-CM | POA: Insufficient documentation

## 2017-01-22 DIAGNOSIS — B9789 Other viral agents as the cause of diseases classified elsewhere: Secondary | ICD-10-CM | POA: Diagnosis not present

## 2017-01-22 DIAGNOSIS — K219 Gastro-esophageal reflux disease without esophagitis: Secondary | ICD-10-CM | POA: Diagnosis not present

## 2017-01-22 DIAGNOSIS — R1110 Vomiting, unspecified: Secondary | ICD-10-CM | POA: Diagnosis not present

## 2017-01-22 MED ORDER — PANTOPRAZOLE SODIUM 40 MG PO TBEC: 40.0000 mg | DELAYED_RELEASE_TABLET | Freq: Every day | ORAL | 3 refills | Status: DC

## 2017-01-22 NOTE — Progress Notes (Signed)
Patient presents to clinic today for annual exam.  Patient is fasting for labs.  Acute Concerns: Patient endorses 1 week of URI symptoms that have been gradually improving since onset. Endorses starting with scratchy throat and dry cough. Endorses nasal congestion. Noted sinus headache last week that has resolved. Denies sinus pain, ear pain or tooth pain at present. Endorses mild, intermittent chest congestion. Denies history of seasonal allergies. Denies fever, chest pain or SOB. Endorses Wednesday and Thursday were the worst and symptoms have gradually improved since onset.  Denies recent travel. Son with cold at home. Endorses taking an OTC allergy medication (unsure of name) with improvement in symptoms.   Chronic Issues: History of Vasovagal Syncope -- Endorses long-standing history starting as a teenager. Initially started as a response to having blood drawn. Averaging episodes as an adult occurring with illness or mild dehydration. Endorses eating throughout the day. Overall endorses good hydration. Last episode of 2 weeks ago. Noted waking up and getting up to urinate. Was unable to urinate at that time so he got up to get some water and felt lightheaded. Denies chest pain, shortness of breath or palpitations during episode. Did not pass out. EMS was called and patient assessed. Only noted mild elevation in BP. EKG within normal limits per patient and mother. Notes being sleep deprived the night before. Significant family history of CAD. Patient without known history of hypertension and hyperlipidemia.   BP Readings from Last 3 Encounters:  01/22/17 110/72  11/01/16 113/71  08/17/16 106/77   Patient endorses difficulty with meals -- noting significant hiccups even with small amounts of food. Sometimes having to vomit up food to alleviate discomfort. Endorses this happens 4 x week. Denies issue with liquids. Denies epigastric pain. Denies abdominal pain, constipation or diarrhea. Denies  change to urinary habits. Has previously taken prilosec for GERD to help these symptoms. Also tried other OTC PPI. Mild improvement only with this medication.   Health Maintenance: Immunizations --Declines flu shot or Tetanus today.  Past Medical History:  Diagnosis Date  . GERD (gastroesophageal reflux disease)   . Kidney stones   . Rotator cuff tendonitis    left  . Vasovagal syncope     Past Surgical History:  Procedure Laterality Date  . INGUINAL HERNIA REPAIR  1994   Left  . left knee surgery  1987   Cyst removal    No current outpatient prescriptions on file prior to visit.   No current facility-administered medications on file prior to visit.     No Known Allergies  Family History  Problem Relation Age of Onset  . Heart attack Mother   . Kidney Stones Mother   . Throat cancer Father     +smoker (deceased)  . Heart disease Maternal Grandfather     Social History   Social History  . Marital status: Single    Spouse name: N/A  . Number of children: N/A  . Years of education: N/A   Occupational History  . Not on file.   Social History Main Topics  . Smoking status: Never Smoker  . Smokeless tobacco: Never Used  . Alcohol use No  . Drug use: No  . Sexual activity: Yes   Other Topics Concern  . Not on file   Social History Narrative   Married, 1 son.  Has two half brothers.   HS Grad.    Occupation: works for [employer] as a Merchandiser, retailbagger.   No T/A/Ds.   Exercise: basketball.  Review of Systems  Constitutional: Negative for fever and weight loss.  HENT: Negative for ear discharge, ear pain, hearing loss and tinnitus.   Eyes: Negative for blurred vision, double vision, photophobia and pain.  Respiratory: Negative for cough and shortness of breath.   Cardiovascular: Negative for chest pain and palpitations.  Gastrointestinal: Positive for heartburn, nausea and vomiting. Negative for abdominal pain, blood in stool, constipation, diarrhea and melena.    Genitourinary: Negative for dysuria, flank pain, frequency, hematuria and urgency.  Musculoskeletal: Negative for falls.  Neurological: Positive for loss of consciousness. Negative for dizziness and headaches.  Endo/Heme/Allergies: Negative for environmental allergies.  Psychiatric/Behavioral: Negative for depression, hallucinations, substance abuse and suicidal ideas. The patient is not nervous/anxious and does not have insomnia.    BP 110/72   Pulse 100   Temp 97.7 F (36.5 C) (Oral)   Resp 14   Ht 5\' 10"  (1.778 m)   Wt 177 lb (80.3 kg)   SpO2 98%   BMI 25.40 kg/m   Physical Exam  Constitutional: He is oriented to person, place, and time and well-developed, well-nourished, and in no distress.  HENT:  Head: Normocephalic and atraumatic.  Right Ear: External ear normal.  Left Ear: External ear normal.  Nose: Nose normal.  Mouth/Throat: Oropharynx is clear and moist. No oropharyngeal exudate.  Eyes: Conjunctivae and EOM are normal. Pupils are equal, round, and reactive to light.  Neck: Neck supple. No thyromegaly present.  Cardiovascular: Normal rate, regular rhythm, normal heart sounds and intact distal pulses.   Pulmonary/Chest: Effort normal and breath sounds normal. No respiratory distress. He has no wheezes. He has no rales. He exhibits no tenderness.  Abdominal: Soft. Bowel sounds are normal. He exhibits no distension and no mass. There is no tenderness. There is no rebound and no guarding.  Genitourinary: Testes/scrotum normal.  Lymphadenopathy:    He has no cervical adenopathy.  Neurological: He is alert and oriented to person, place, and time.  Skin: Skin is warm and dry. No rash noted.  Psychiatric: Affect normal.  Vitals reviewed.  Assessment/Plan: 1. Vasovagal syncope Long-standing history -- usually provoked by finger sticks, or anxiety attacks. Discussed supportive measures to limit this occurrences. Recommended further assessment but patient wishes to give some  thought to this first.   2. Gastroesophageal reflux disease, esophagitis presence not specified W/ concern for stricture giving emetic episodes. Will start Protonix and GERD diet. Start probiotic. Smaller meal portions. Referral placed to GI for further assessment.   3. Viral sinusitis Rx Flonase.  Increase fluids.  Rest.  Saline nasal spray.  Probiotic.  Mucinex as directed.  Humidifier in bedroom.  Call or return to clinic if symptoms are not improving.   FU scheduled for CPE w fasting labs.   Piedad Climes, PA-C

## 2017-01-22 NOTE — Patient Instructions (Signed)
Please start the Protonix as directed. For a few days (2-3), also take a Zantac twice daily.  Follow the diet below. Eat small portions. I am setting you up with Gastroenterology for further assessment.  We are sorry that you are not feeling well.  Here is how we plan to help!  Based on what you have shared with me it looks like you have sinusitis.  Sinusitis is inflammation and infection in the sinus cavities of the head.  Based on your presentation I believe you most likely have Acute Viral Sinusitis.This is an infection most likely caused by a virus. There is not specific treatment for viral sinusitis other than to help you with the symptoms until the infection runs its course.  You may use an oral decongestant such as Mucinex D or if you have glaucoma or high blood pressure use plain Mucinex. Saline nasal spray help and can safely be used as often as needed for congestion, I have prescribed: Fluticasone nasal spray two sprays in each nostril twice a day  Some authorities believe that zinc sprays or the use of Echinacea may shorten the course of your symptoms.  Sinus infections are not as easily transmitted as other respiratory infection, however we still recommend that you avoid close contact with loved ones, especially the very young and elderly.  Remember to wash your hands thoroughly throughout the day as this is the number one way to prevent the spread of infection!  Home Care:  Only take medications as instructed by your medical team.  Complete the entire course of an antibiotic.  Do not take these medications with alcohol.  A steam or ultrasonic humidifier can help congestion.  You can place a towel over your head and breathe in the steam from hot water coming from a faucet.  Avoid close contacts especially the very young and the elderly.  Cover your mouth when you cough or sneeze.  Always remember to wash your hands.  Get Help Right Away If:  You develop worsening fever or  sinus pain.  You develop a severe head ache or visual changes.  Your symptoms persist after you have completed your treatment plan.  Make sure you  Understand these instructions.  Will watch your condition.  Will get help right away if you are not doing well or get worse.

## 2017-01-22 NOTE — Progress Notes (Signed)
Pre visit review using our clinic review tool, if applicable. No additional management support is needed unless otherwise documented below in the visit note. 

## 2017-01-24 ENCOUNTER — Ambulatory Visit: Payer: BLUE CROSS/BLUE SHIELD | Admitting: Physician Assistant

## 2017-02-05 ENCOUNTER — Encounter: Payer: BLUE CROSS/BLUE SHIELD | Admitting: Physician Assistant

## 2017-02-12 ENCOUNTER — Telehealth: Payer: Self-pay | Admitting: Physician Assistant

## 2017-02-12 ENCOUNTER — Encounter: Payer: BLUE CROSS/BLUE SHIELD | Admitting: Physician Assistant

## 2017-02-12 MED ORDER — FLUTICASONE PROPIONATE 50 MCG/ACT NA SUSP
2.0000 | Freq: Every day | NASAL | 0 refills | Status: DC
Start: 2017-02-12 — End: 2020-10-04

## 2017-02-12 NOTE — Telephone Encounter (Signed)
Refills sent

## 2017-02-12 NOTE — Telephone Encounter (Signed)
Notified patient's wife rx has been sent to pharmacy.

## 2017-02-12 NOTE — Telephone Encounter (Signed)
Patient's wife calling to cancel patient's appt for cpe today as he has a sinus infection and does not feel well to come in.  At last ov on 01/22/17, [facility] was going to provide a rx for Flonase but it has not been sent in to pharmacy yet.  Requesting rx to be sent in to [facility] [place].

## 2017-03-08 ENCOUNTER — Other Ambulatory Visit: Payer: Self-pay | Admitting: Emergency Medicine

## 2017-03-08 MED ORDER — PANTOPRAZOLE SODIUM 40 MG PO TBEC: 40.0000 mg | DELAYED_RELEASE_TABLET | Freq: Every day | ORAL | 0 refills | Status: DC

## 2017-08-14 ENCOUNTER — Encounter: Payer: Self-pay | Admitting: Physician Assistant

## 2017-08-14 ENCOUNTER — Ambulatory Visit (INDEPENDENT_AMBULATORY_CARE_PROVIDER_SITE_OTHER): Payer: BLUE CROSS/BLUE SHIELD | Admitting: Physician Assistant

## 2017-08-14 VITALS — BP 104/80 | HR 89 | Temp 98.8°F | Resp 14 | Ht 70.0 in | Wt 179.0 lb

## 2017-08-14 DIAGNOSIS — A084 Viral intestinal infection, unspecified: Secondary | ICD-10-CM | POA: Diagnosis not present

## 2017-08-14 MED ORDER — ONDANSETRON HCL 4 MG PO TABS: 4.0000 mg | ORAL_TABLET | Freq: Three times a day (TID) | ORAL | 0 refills | Status: DC | PRN

## 2017-08-14 NOTE — Progress Notes (Signed)
   Patient presents to clinic today c/o 1 day of nausea, vomiting and diarrhea, starting yesterday. Endorses some chills and low-grade fever. Denies recent travel or sick contact. Denies melena, hematochezia or tenesmus.  Endorses feeling better since this morning. Is having some muscle pain and fatigue. Did take an anti-diarrheal this morning which has helped. Son has been sick with similar symptoms recently.   Past Medical History:  Diagnosis Date  . GERD (gastroesophageal reflux disease)   . Kidney stones   . Rotator cuff tendonitis    left  . Vasovagal syncope     Current Outpatient Prescriptions on File Prior to Visit  Medication Sig Dispense Refill  . pantoprazole (PROTONIX) 40 MG tablet Take 1 tablet (40 mg total) by mouth daily. 90 tablet 0  . fluticasone (FLONASE) 50 MCG/ACT nasal spray Place 2 sprays into both nostrils daily. (Patient not taking: Reported on 08/14/2017) 16 g 0   No current facility-administered medications on file prior to visit.     No Known Allergies  Family History  Problem Relation Age of Onset  . Heart attack Mother   . Kidney Stones Mother   . Throat cancer Father        +smoker (deceased)  . Healthy Brother   . Healthy Son   . Heart disease Maternal Grandfather     Social History   Social History  . Marital status: Married    Spouse name: N/A  . Number of children: 1  . Years of education: N/A   Social History Main Topics  . Smoking status: Never Smoker  . Smokeless tobacco: Never Used  . Alcohol use No  . Drug use: No  . Sexual activity: Yes    Partners: Female     Comment: wife   Other Topics Concern  . None   Social History Narrative   Married, 1 son.  Has two half brothers.   HS Grad.    Occupation: works for [employer] as a Merchandiser, retail.   No T/A/Ds.   Exercise: basketball.         Review of Systems - See HPI.  All other ROS are negative.  BP 104/80   Pulse 89   Temp 98.8 F (37.1 C) (Oral)   Resp 14   Ht  (1.778 m)    Wt 179 lb (81.2 kg)   SpO2 98%   BMI 25.68 kg/m   Physical Exam  Constitutional: He is oriented to person, place, and time and well-developed, well-nourished, and in no distress.  HENT:  Head: Normocephalic and atraumatic.  Eyes: Conjunctivae are normal.  Neck: Neck supple.  Cardiovascular: Normal rate, regular rhythm, normal heart sounds and intact distal pulses.   Pulmonary/Chest: Effort normal and breath sounds normal. No respiratory distress. He has no wheezes. He has no rales. He exhibits no tenderness.  Abdominal: Soft. Bowel sounds are normal.  Neurological: He is alert and oriented to person, place, and time.  Skin: Skin is warm and dry. No rash noted.  Psychiatric: Affect normal.  Vitals reviewed.  Assessment/Plan: 1. Viral gastroenteritis Exam unremarkable except for some signs of mild dehydration. Is tolerating PO fluids. Encouraged continued hydration. Zofran for nausea. Start [employer] OTC medications reviewed. Strict return precautions given.   Piedad Climes, PA-C

## 2017-08-14 NOTE — Progress Notes (Signed)
Pre visit review using our clinic review tool, if applicable. No additional management support is needed unless otherwise documented below in the visit note. 

## 2017-08-14 NOTE — Patient Instructions (Signed)
Please stay well-hydrated and get plenty of rest. I believe you are over the worst part of this. Typically nausea is the last thing to go away.  You can use the Zofran as directed for nausea. Hydrate, Hydrate, Hydrate! Follow the diet below when restarting foods.  If anything worsens or you are unable to tolerate fluids, please go to the ER.   Food Choices to Help Relieve Diarrhea, Adult When you have diarrhea, the foods you eat and your eating habits are very important. Choosing the right foods and drinks can help:  Relieve diarrhea.  Replace lost fluids and nutrients.  Prevent dehydration.  What general guidelines should I follow? Relieving diarrhea  Choose foods with less than 2 g or .07 oz. of fiber per serving.  Limit fats to less than 8 tsp (38 g or 1.34 oz.) a day.  Avoid the following: ? Foods and beverages sweetened with high-fructose corn syrup, honey, or sugar alcohols such as xylitol, sorbitol, and mannitol. ? Foods that contain a lot of fat or sugar. ? Fried, greasy, or spicy foods. ? High-fiber grains, breads, and cereals. ? Raw fruits and vegetables.  Eat foods that are rich in probiotics. These foods include dairy products such as yogurt and fermented milk products. They help increase healthy bacteria in the stomach and intestines (gastrointestinal tract, or GI tract).  If you have lactose intolerance, avoid dairy products. These may make your diarrhea worse.  Take medicine to help stop diarrhea (antidiarrheal medicine) only as told by your health care provider. Replacing nutrients  Eat small meals or snacks every 3-4 hours.  Eat bland foods, such as white rice, toast, or baked potato, until your diarrhea starts to get better. Gradually reintroduce nutrient-rich foods as tolerated or as told by your health care provider. This includes: ? Well-cooked protein foods. ? Peeled, seeded, and soft-cooked fruits and vegetables. ? Low-fat dairy products.  Take  vitamin and mineral supplements as told by your health care provider. Preventing dehydration   Start by sipping water or a special solution to prevent dehydration (oral rehydration solution, ORS). Urine that is clear or pale yellow means that you are getting enough fluid.  Try to drink at least 8-10 cups of fluid each day to help replace lost fluids.  You may add other liquids in addition to water, such as clear juice or decaffeinated sports drinks, as tolerated or as told by your health care provider.  Avoid drinks with caffeine, such as coffee, tea, or soft drinks.  Avoid alcohol. What foods are recommended? The items listed may not be a complete list. Talk with your health care provider about what dietary choices are best for you. Grains White rice. White, [place], or pita breads (fresh or toasted), including plain rolls, buns, or bagels. White pasta. Saltine, soda, or graham crackers. Pretzels. Low-fiber cereal. Cooked cereals made with water (such as cornmeal, farina, or cream cereals). Plain muffins. Matzo. Melba toast. Zwieback. Vegetables Potatoes (without the skin). Most well-cooked and canned vegetables without skins or seeds. Tender lettuce. Fruits Apple sauce. Fruits canned in juice. Cooked apricots, cherries, grapefruit, peaches, pears, or plums. Fresh bananas and cantaloupe. Meats and other protein foods Baked or boiled chicken. Eggs. Tofu. Fish. Seafood. Smooth nut butters. Ground or well-cooked tender beef, ham, veal, lamb, pork, or poultry. Dairy Plain yogurt, kefir, and unsweetened liquid yogurt. Lactose-free milk, buttermilk, skim milk, or soy milk. Low-fat or nonfat hard cheese. Beverages Water. Low-calorie sports drinks. Fruit juices without pulp. Strained tomato and vegetable  juices. Decaffeinated teas. Sugar-free beverages not sweetened with sugar alcohols. Oral rehydration solutions, if approved by your health care provider. Seasoning and other foods Bouillon,  broth, or soups made from recommended foods. What foods are not recommended? The items listed may not be a complete list. Talk with your health care provider about what dietary choices are best for you. Grains Whole grain, whole wheat, bran, or rye breads, rolls, pastas, and crackers. Wild or brown rice. Whole grain or bran cereals. Barley. Oats and oatmeal. Corn tortillas or taco shells. Granola. Popcorn. Vegetables Raw vegetables. Fried vegetables. Cabbage, broccoli, Brussels sprouts, artichokes, baked beans, beet greens, corn, kale, legumes, peas, sweet potatoes, and yams. Potato skins. Cooked spinach and cabbage. Fruits Dried fruit, including raisins and dates. Raw fruits. Stewed or dried prunes. Canned fruits with syrup. Meat and other protein foods Fried or fatty meats. Deli meats. Chunky nut butters. Nuts and seeds. Beans and lentils. Tomasa Blase. Hot dogs. Sausage. Dairy High-fat cheeses. Whole milk, chocolate milk, and beverages made with milk, such as milk shakes. Half-and-half. Cream. sour cream. Ice cream. Beverages Caffeinated beverages (such as coffee, tea, soda, or energy drinks). Alcoholic beverages. Fruit juices with pulp. Prune juice. Soft drinks sweetened with high-fructose corn syrup or sugar alcohols. High-calorie sports drinks. Fats and oils Butter. Cream sauces. Margarine. Salad oils. Plain salad dressings. Olives. Avocados. Mayonnaise. Sweets and desserts Sweet rolls, doughnuts, and sweet breads. Sugar-free desserts sweetened with sugar alcohols such as xylitol and sorbitol. Seasoning and other foods Honey. Hot sauce. Chili powder. Gravy. Cream-based or milk-based soups. Pancakes and waffles. Summary  When you have diarrhea, the foods you eat and your eating habits are very important.  Make sure you get at least 8-10 cups of fluid each day, or enough to keep your urine clear or pale yellow.  Eat bland foods and gradually reintroduce healthy, nutrient-rich foods as  tolerated, or as told by your health care provider.  Avoid high-fiber, fried, greasy, or spicy foods. This information is not intended to replace advice given to you by your health care provider. Make sure you discuss any questions you have with your health care provider. Document Released: 02/03/2004 Document Revised: 11/10/2016 Document Reviewed: 11/10/2016 Elsevier Interactive Patient Education  2017 [employer].

## 2018-01-14 ENCOUNTER — Encounter: Payer: Self-pay | Admitting: Physician Assistant

## 2018-01-14 ENCOUNTER — Other Ambulatory Visit: Payer: Self-pay

## 2018-01-14 ENCOUNTER — Ambulatory Visit: Payer: BLUE CROSS/BLUE SHIELD | Admitting: Physician Assistant

## 2018-01-14 ENCOUNTER — Other Ambulatory Visit: Payer: Self-pay | Admitting: Physician Assistant

## 2018-01-14 VITALS — BP 118/80 | HR 73 | Temp 98.0°F | Resp 16 | Ht 70.0 in | Wt 182.0 lb

## 2018-01-14 DIAGNOSIS — B9789 Other viral agents as the cause of diseases classified elsewhere: Secondary | ICD-10-CM | POA: Diagnosis not present

## 2018-01-14 DIAGNOSIS — J069 Acute upper respiratory infection, unspecified: Secondary | ICD-10-CM

## 2018-01-14 MED ORDER — BENZONATATE 100 MG PO CAPS: 100.0000 mg | ORAL_CAPSULE | Freq: Three times a day (TID) | ORAL | 0 refills | Status: DC | PRN

## 2018-01-14 NOTE — Progress Notes (Signed)
Patient presents to clinic today c/o 4.5 days of nasal congestion, dry cough, headache and fatigue. Denies fever, chills, chest pain, chest congestion or SOB. Wife and son with similar symptoms. Has been taking Flonase to help with symptoms. Denies recent travel.  Past Medical History:  Diagnosis Date  . GERD (gastroesophageal reflux disease)   . Kidney stones   . Rotator cuff tendonitis    left  . Vasovagal syncope     Current Outpatient Medications on File Prior to Visit  Medication Sig Dispense Refill  . fluticasone (FLONASE) 50 MCG/ACT nasal spray Place 2 sprays into both nostrils daily. 16 g 0  . pantoprazole (PROTONIX) 40 MG tablet Take 1 tablet (40 mg total) by mouth daily. 90 tablet 0   No current facility-administered medications on file prior to visit.     No Known Allergies  Family History  Problem Relation Age of Onset  . Heart attack Mother   . Kidney Stones Mother   . Throat cancer Father        +smoker (deceased)  . Healthy Brother   . Healthy Son   . Heart disease Maternal Grandfather     Social History   Socioeconomic History  . Marital status: Married    Spouse name: None  . Number of children: 1  . Years of education: None  . Highest education level: None  Social Needs  . Financial resource strain: None  . Food insecurity - worry: None  . Food insecurity - inability: None  . Transportation needs - medical: None  . Transportation needs - non-medical: None  Occupational History  . None  Tobacco Use  . Smoking status: Never Smoker  . Smokeless tobacco: Never Used  Substance and Sexual Activity  . Alcohol use: No  . Drug use: No  . Sexual activity: Yes    Partners: Female    Comment: wife  Other Topics Concern  . None  Social History Narrative   Married, 1 son.  Has two half brothers.   HS Grad.    Occupation: works for [employer] as a Merchandiser, retailbagger.   No T/A/Ds.   Exercise: basketball.      Review of Systems - See HPI.  All other ROS are  negative.  BP 118/80   Pulse 73   Temp 98 F (36.7 C) (Oral)   Resp 16   Ht 5\' 10"  (1.778 m)   Wt 182 lb (82.6 kg)   SpO2 98%   BMI 26.11 kg/m   Physical Exam  Constitutional: He is oriented to person, place, and time and well-developed, well-nourished, and in no distress.  HENT:  Head: Normocephalic and atraumatic.  Right Ear: Tympanic membrane normal.  Left Ear: Tympanic membrane normal.  Nose: Mucosal edema and rhinorrhea present. Right sinus exhibits no maxillary sinus tenderness and no frontal sinus tenderness. Left sinus exhibits no maxillary sinus tenderness and no frontal sinus tenderness.  Mouth/Throat: Uvula is midline, oropharynx is clear and moist and mucous membranes are normal.  Eyes: Conjunctivae are normal.  Neck: Neck supple.  Cardiovascular: Normal rate, regular rhythm, normal heart sounds and intact distal pulses.  Pulmonary/Chest: Effort normal and breath sounds normal. No respiratory distress. He has no wheezes. He has no rales. He exhibits no tenderness.  Neurological: He is alert and oriented to person, place, and time.  Skin: Skin is warm and dry. No rash noted.  Psychiatric: Affect normal.  Vitals reviewed.  Assessment/Plan: 1. Viral URI with cough Supportive measures and OTC medications  reviewed with patient.  Start Tessalon. Symptoms improved today per patient. Should continue to resolve. Return precautions reviewed with patient.    Piedad Climes, PA-C

## 2018-01-14 NOTE — Patient Instructions (Signed)
Please stay well-hydrated and get plenty of rest.  Start a Tylenol Sinus OTC to help with symptoms.  Also use the Tessalon as directed for cough. Symptoms should continue to resolve. Please call or come see me if symptoms worsen or new symptoms develop.   Upper Respiratory Infection, Adult Most upper respiratory infections (URIs) are caused by a virus. A URI affects the nose, throat, and upper air passages. The most common type of URI is often called "the common cold." Follow these instructions at home:  Take medicines only as told by your doctor.  Gargle warm saltwater or take cough drops to comfort your throat as told by your doctor.  Use a warm mist humidifier or inhale steam from a shower to increase air moisture. This may make it easier to breathe.  Drink enough fluid to keep your pee (urine) clear or pale yellow.  Eat soups and other clear broths.  Have a healthy diet.  Rest as needed.  Go back to work when your fever is gone or your doctor says it is okay. ? You may need to stay home longer to avoid giving your URI to others. ? You can also wear a face mask and wash your hands often to prevent spread of the virus.  Use your inhaler more if you have asthma.  Do not use any tobacco products, including cigarettes, chewing tobacco, or electronic cigarettes. If you need help quitting, ask your doctor. Contact a doctor if:  You are getting worse, not better.  Your symptoms are not helped by medicine.  You have chills.  You are getting more short of breath.  You have brown or red mucus.  You have yellow or brown discharge from your nose.  You have pain in your face, especially when you bend forward.  You have a fever.  You have puffy (swollen) neck glands.  You have pain while swallowing.  You have white areas in the back of your throat. Get help right away if:  You have very bad or constant: ? Headache. ? Ear pain. ? Pain in your forehead, behind your  eyes, and over your cheekbones (sinus pain). ? Chest pain.  You have long-lasting (chronic) lung disease and any of the following: ? Wheezing. ? Long-lasting cough. ? Coughing up blood. ? A change in your usual mucus.  You have a stiff neck.  You have changes in your: ? Vision. ? Hearing. ? Thinking. ? Mood. This information is not intended to replace advice given to you by your health care provider. Make sure you discuss any questions you have with your health care provider. Document Released: 05/01/2008 Document Revised: 07/16/2016 Document Reviewed: 02/18/2014 Elsevier Interactive Patient Education  2018 [employer]

## 2018-01-15 ENCOUNTER — Telehealth: Payer: Self-pay | Admitting: Physician Assistant

## 2018-01-15 MED ORDER — AZITHROMYCIN 250 MG PO TABS: ORAL_TABLET | ORAL | 0 refills | Status: DC

## 2018-01-15 NOTE — Telephone Encounter (Signed)
Again I do feel that symptoms are viral in nature. If they are adamant on antibiotic therapy then I will sent in a script for a Z-pack for him to take.

## 2018-01-15 NOTE — Telephone Encounter (Signed)
Spoke with patient wife and advised we have sent in the rx for antibiotic. Advised if symptoms do not improved after antibiotic will need to be re-evaluated. She was agreeable.

## 2018-01-15 NOTE — Telephone Encounter (Unsigned)
Copied from CRM [phone]. Topic: Quick Communication - See Telephone Encounter >> Jan 15, 2018  2:16 PM Floria RavelingStovall, Shana A wrote: CRM for notification. See Telephone encounter for: pt wife called in and said that pt has gotten worse since yesterday.  She would like to know if Selena BattenCody would be willing to call in antibiotic for him?  Pharmacy - [facility] in [place]   01/15/18.

## 2018-01-15 NOTE — Telephone Encounter (Signed)
Wife stopped by and asked that we call her at [phone]. If Selena BattenCody is able to call anything in for him.

## 2018-07-09 ENCOUNTER — Ambulatory Visit: Payer: BLUE CROSS/BLUE SHIELD | Admitting: Physician Assistant

## 2018-07-09 ENCOUNTER — Other Ambulatory Visit: Payer: Self-pay

## 2018-07-09 ENCOUNTER — Encounter: Payer: Self-pay | Admitting: Physician Assistant

## 2018-07-09 VITALS — BP 124/84 | HR 57 | Temp 97.5°F | Resp 16 | Ht 70.0 in | Wt 181.6 lb

## 2018-07-09 DIAGNOSIS — J0100 Acute maxillary sinusitis, unspecified: Secondary | ICD-10-CM

## 2018-07-09 MED ORDER — AMOXICILLIN-POT CLAVULANATE 875-125 MG PO TABS: 1.0000 | ORAL_TABLET | Freq: Two times a day (BID) | ORAL | 0 refills | Status: DC

## 2018-07-09 NOTE — Progress Notes (Signed)
Patient presents to clinic today c/o fatigue, sinus pressure, sinus headache worse with leaning forward. Notes maxillary sinus pain. Denies fever. Some significant PND with dry cough. Denies chest congestion. Denies recent travel or sick contact.  Is taking Flonase and antihistamine as directed. .   Past Medical History:  Diagnosis Date  . GERD (gastroesophageal reflux disease)   . Kidney stones   . Rotator cuff tendonitis    left  . Vasovagal syncope     Current Outpatient Medications on File Prior to Visit  Medication Sig Dispense Refill  . fluticasone (FLONASE) 50 MCG/ACT nasal spray Place 2 sprays into both nostrils daily. 16 g 0  . pantoprazole (PROTONIX) 40 MG tablet Take 1 tablet (40 mg total) by mouth daily. 90 tablet 0   No current facility-administered medications on file prior to visit.     No Known Allergies  Family History  Problem Relation Age of Onset  . Heart attack Mother   . Kidney Stones Mother   . Throat cancer Father        +smoker (deceased)  . Healthy Brother   . Healthy Son   . Heart disease Maternal Grandfather     Social History   Socioeconomic History  . Marital status: Married    Spouse name: Not on file  . Number of children: 1  . Years of education: Not on file  . Highest education level: Not on file  Occupational History  . Not on file  Social Needs  . Financial resource strain: Not on file  . Food insecurity:    Worry: Not on file    Inability: Not on file  . Transportation needs:    Medical: Not on file    Non-medical: Not on file  Tobacco Use  . Smoking status: Never Smoker  . Smokeless tobacco: Never Used  Substance and Sexual Activity  . Alcohol use: No  . Drug use: No  . Sexual activity: Yes    Partners: Female    Comment: wife  Lifestyle  . Physical activity:    Days per week: Not on file    Minutes per session: Not on file  . Stress: Not on file  Relationships  . Social connections:    Talks on phone: Not on  file    Gets together: Not on file    Attends religious service: Not on file    Active member of club or organization: Not on file    Attends meetings of clubs or organizations: Not on file    Relationship status: Not on file  Other Topics Concern  . Not on file  Social History Narrative   Married, 1 son.  Has two half brothers.   HS Grad.    Occupation: works for [employer] as a Merchandiser, retailbagger.   No T/A/Ds.   Exercise: basketball.      Review of Systems - See HPI.  All other ROS are negative.  BP 124/84   Pulse (!) 57   Temp (!) 97.5 F (36.4 C) (Oral)   Resp 16   Ht 5\' 10"  (1.778 m)   Wt 181 lb 9.6 oz (82.4 kg)   SpO2 97%   BMI 26.06 kg/m   Physical Exam  Constitutional: He is oriented to person, place, and time. He appears well-developed and well-nourished.  HENT:  Head: Normocephalic and atraumatic.  Right Ear: External ear normal.  Left Ear: External ear normal.  Nose: Mucosal edema and rhinorrhea present. Right sinus exhibits maxillary sinus  tenderness and frontal sinus tenderness. Left sinus exhibits frontal sinus tenderness.  Mouth/Throat: Uvula is midline and oropharynx is clear and moist.  Eyes: Conjunctivae are normal.  Neck: Neck supple.  Cardiovascular: Normal rate, regular rhythm, normal heart sounds and intact distal pulses.  Neurological: He is alert and oriented to person, place, and time.  Vitals reviewed.  Assessment/Plan: 1. Acute non-recurrent maxillary sinusitis Rx Augmentin.  Increase fluids.  Rest.  Saline nasal spray.  Probiotic.  Mucinex as directed.  Humidifier in bedroom. Continue allergy medications.  Call or return to clinic if symptoms are not improving.  - amoxicillin-clavulanate (AUGMENTIN) 875-125 MG tablet; Take 1 tablet by mouth 2 (two) times daily.  Dispense: 14 tablet; Refill: 0   Piedad ClimesWilliam Cody Colbey Wirtanen, PA-C

## 2018-07-09 NOTE — Patient Instructions (Signed)
Please take antibiotic as directed.  Increase fluid intake.  Use Saline nasal spray.  Take a daily multivitamin. Continue Flonase and Xyzla. Delsym if needed for cough.  Place a humidifier in the bedroom.  Please call or return clinic if symptoms are not improving.  Sinusitis Sinusitis is redness, soreness, and swelling (inflammation) of the paranasal sinuses. Paranasal sinuses are air pockets within the bones of your face (beneath the eyes, the middle of the forehead, or above the eyes). In healthy paranasal sinuses, mucus is able to drain out, and air is able to circulate through them by way of your nose. However, when your paranasal sinuses are inflamed, mucus and air can become trapped. This can allow bacteria and other germs to grow and cause infection. Sinusitis can develop quickly and last only a short time (acute) or continue over a long period (chronic). Sinusitis that lasts for more than 12 weeks is considered chronic.  CAUSES  Causes of sinusitis include:  Allergies.  Structural abnormalities, such as displacement of the cartilage that separates your nostrils (deviated septum), which can decrease the air flow through your nose and sinuses and affect sinus drainage.  Functional abnormalities, such as when the small hairs (cilia) that line your sinuses and help remove mucus do not work properly or are not present. SYMPTOMS  Symptoms of acute and chronic sinusitis are the same. The primary symptoms are pain and pressure around the affected sinuses. Other symptoms include:  Upper toothache.  Earache.  Headache.  Bad breath.  Decreased sense of smell and taste.  A cough, which worsens when you are lying flat.  Fatigue.  Fever.  Thick drainage from your nose, which often is green and may contain pus (purulent).  Swelling and warmth over the affected sinuses. DIAGNOSIS  Your caregiver will perform a physical exam. During the exam, your caregiver may:  Look in your nose for  signs of abnormal growths in your nostrils (nasal polyps).  Tap over the affected sinus to check for signs of infection.  View the inside of your sinuses (endoscopy) with a special imaging device with a light attached (endoscope), which is inserted into your sinuses. If your caregiver suspects that you have chronic sinusitis, one or more of the following tests may be recommended:  Allergy tests.  Nasal culture A sample of mucus is taken from your nose and sent to a lab and screened for bacteria.  Nasal cytology A sample of mucus is taken from your nose and examined by your caregiver to determine if your sinusitis is related to an allergy. TREATMENT  Most cases of acute sinusitis are related to a viral infection and will resolve on their own within 10 days. Sometimes medicines are prescribed to help relieve symptoms (pain medicine, decongestants, nasal steroid sprays, or saline sprays).  However, for sinusitis related to a bacterial infection, your caregiver will prescribe antibiotic medicines. These are medicines that will help kill the bacteria causing the infection.  Rarely, sinusitis is caused by a fungal infection. In theses cases, your caregiver will prescribe antifungal medicine. For some cases of chronic sinusitis, surgery is needed. Generally, these are cases in which sinusitis recurs more than 3 times per year, despite other treatments. HOME CARE INSTRUCTIONS   Drink plenty of water. Water helps thin the mucus so your sinuses can drain more easily.  Use a humidifier.  Inhale steam 3 to 4 times a day (for example, sit in the bathroom with the shower running).  Apply a warm, moist washcloth  to your face 3 to 4 times a day, or as directed by your caregiver.  Use saline nasal sprays to help moisten and clean your sinuses.  Take over-the-counter or prescription medicines for pain, discomfort, or fever only as directed by your caregiver. SEEK IMMEDIATE MEDICAL CARE IF:  You have  increasing pain or severe headaches.  You have nausea, vomiting, or drowsiness.  You have swelling around your face.  You have vision problems.  You have a stiff neck.  You have difficulty breathing. MAKE SURE YOU:   Understand these instructions.  Will watch your condition.  Will get help right away if you are not doing well or get worse. Document Released: 11/13/2005 Document Revised: 02/05/2012 Document Reviewed: 11/28/2011 [facility] Patient Information 2014 [place], [place].

## 2018-08-13 ENCOUNTER — Ambulatory Visit: Payer: BLUE CROSS/BLUE SHIELD | Admitting: Sports Medicine

## 2018-08-13 ENCOUNTER — Other Ambulatory Visit: Payer: Self-pay | Admitting: Sports Medicine

## 2018-08-13 ENCOUNTER — Ambulatory Visit (INDEPENDENT_AMBULATORY_CARE_PROVIDER_SITE_OTHER): Payer: BLUE CROSS/BLUE SHIELD

## 2018-08-13 ENCOUNTER — Encounter: Payer: Self-pay | Admitting: Sports Medicine

## 2018-08-13 VITALS — BP 127/87 | HR 69

## 2018-08-13 DIAGNOSIS — M778 Other enthesopathies, not elsewhere classified: Secondary | ICD-10-CM

## 2018-08-13 DIAGNOSIS — S93529A Sprain of metatarsophalangeal joint of unspecified toe(s), initial encounter: Secondary | ICD-10-CM

## 2018-08-13 DIAGNOSIS — M779 Enthesopathy, unspecified: Principal | ICD-10-CM

## 2018-08-13 DIAGNOSIS — M79672 Pain in left foot: Secondary | ICD-10-CM | POA: Diagnosis not present

## 2018-08-13 DIAGNOSIS — M205X2 Other deformities of toe(s) (acquired), left foot: Secondary | ICD-10-CM

## 2018-08-13 MED ORDER — METHYLPREDNISOLONE 4 MG PO TBPK: ORAL_TABLET | ORAL | 0 refills | Status: DC

## 2018-08-13 MED ORDER — MELOXICAM 15 MG PO TABS: 15.0000 mg | ORAL_TABLET | Freq: Every day | ORAL | 0 refills | Status: DC

## 2018-08-13 NOTE — Progress Notes (Signed)
Subjective: Calvin Hood is a 37 y.o. male patient who presents to office for evaluation of left foot pain. Patient complains of progressive pain at the bottom of the foot but most especially at the big toe joint states that it started last week after he was playing a game of basketball and states that he started having pain during the warmer months afterwards he had pain that was kind of annoying 4 out of 10 states that he did a little bit of icing but otherwise has not tried any other treatment.  Patient states that the pain is worse when he tries to stand up on his tippy toes states that he has had a history of fracture before on his foot but never did anything about it.  Review of Systems  Musculoskeletal: Positive for joint pain.  All other systems reviewed and are negative.    Patient Active Problem List   Diagnosis Date Noted  . Vasovagal syncope   . GERD (gastroesophageal reflux disease)   . Maxillary sinusitis 10/05/2015  . Rotator cuff tendonitis 08/13/2014    Current Outpatient Medications on File Prior to Visit  Medication Sig Dispense Refill  . amoxicillin-clavulanate (AUGMENTIN) 875-125 MG tablet Take 1 tablet by mouth 2 (two) times daily. (Patient not taking: Reported on 08/13/2018) 14 tablet 0  . fluticasone (FLONASE) 50 MCG/ACT nasal spray Place 2 sprays into both nostrils daily. 16 g 0  . pantoprazole (PROTONIX) 40 MG tablet Take 1 tablet (40 mg total) by mouth daily. 90 tablet 0   No current facility-administered medications on file prior to visit.     No Known Allergies  Objective:  General: Alert and oriented x3 in no acute distress  Dermatology: No open lesions bilateral lower extremities, no webspace macerations, no ecchymosis bilateral, all nails x 10 are well manicured.  Vascular: Dorsalis Pedis and Posterior Tibial pedal pulses palpable, Capillary Fill Time 3 seconds,(+) pedal hair growth bilateral, no edema bilateral lower extremities, Temperature gradient  within normal limits.  Neurology: Michaell Cowing sensation intact via light touch bilateral.  Musculoskeletal: Mild tenderness at left first metatarsophalangeal joint with mild early dorsal bunion formation and limitus on range of motion no clicking popping grinding.  Strength within normal limits in all groups bilateral.   Gait: Minimal antalgic gait  Xrays  Left foot   Impression: Normal osseous mineralization there is a mild avulsion chip fracture at the medial aspect of the first metatarsal head likely old in nature with mild arthritic changes no other acute findings.  Assessment and Plan: Problem List Items Addressed This Visit    None    Visit Diagnoses    Capsulitis of left foot    -  Primary   Relevant Medications   methylPREDNISolone (MEDROL DOSEPAK) 4 MG TBPK tablet   meloxicam (MOBIC) 15 MG tablet   Other Relevant Orders   DG Foot Complete Left   Hallux limitus of left foot       Avulsion fleck at 1st MTPJ on left   Turf toe, initial encounter       Left foot pain           -Complete examination performed -Xrays reviewed -Discussed treatement options for likely sprain versus hallux limitus versus old avulsion fleck at the first metatarsophalangeal joint on left -Rx Medrol Dosepak and meloxicam to take as instructed -Recommend patient to get over-the-counter insoles to help further offload the first metatarsophalangeal joint -Advised patient to avoid activities that may be aggravating at this time -Patient to  return to office as needed or in 3 weeks if still symptomatic sooner if condition worsens.  Asencion Islamitorya Markeita Alicia, DPM

## 2018-08-13 NOTE — Patient Instructions (Signed)
Superfeet insoles from [employer] or [employer]

## 2018-08-14 MED ORDER — PREDNISONE 10 MG PO TABS: ORAL_TABLET | ORAL | 0 refills | Status: DC

## 2018-08-22 ENCOUNTER — Ambulatory Visit: Payer: Self-pay | Admitting: Podiatry

## 2018-09-03 ENCOUNTER — Ambulatory Visit: Payer: BLUE CROSS/BLUE SHIELD | Admitting: Sports Medicine

## 2018-09-04 ENCOUNTER — Other Ambulatory Visit: Payer: Self-pay | Admitting: Sports Medicine

## 2018-09-04 DIAGNOSIS — M778 Other enthesopathies, not elsewhere classified: Secondary | ICD-10-CM

## 2018-09-04 DIAGNOSIS — M779 Enthesopathy, unspecified: Principal | ICD-10-CM

## 2018-10-31 ENCOUNTER — Other Ambulatory Visit: Payer: Self-pay | Admitting: Physician Assistant

## 2018-10-31 MED ORDER — PANTOPRAZOLE SODIUM 40 MG PO TBEC: 40.0000 mg | DELAYED_RELEASE_TABLET | Freq: Every day | ORAL | 0 refills | Status: DC

## 2018-10-31 NOTE — Telephone Encounter (Signed)
Copied from CRM [phone]#[id number]. Topic: Quick Communication - Rx Refill/Question >> Oct 31, 2018  3:48 PM Mcneil, Ja-Kwan wrote: Medication: pantoprazole (PROTONIX) 40 MG tablet  Has the patient contacted their pharmacy? no  Preferred Pharmacy (with phone number or street name): [id number] [place], [place] - [address] [phone] (Phone) [phone])  Agent: Please be advised that RX refills may take up to 3 business days. We ask that you follow-up with your pharmacy.

## 2018-10-31 NOTE — Telephone Encounter (Signed)
Requested medication (s) are due for refill today:  yes  Requested medication (s) are on the active medication list:  yes  Future visit scheduled: no    Last Refill:  03/08/17; #90; no refills  Rx expired   Requested Prescriptions  Pending Prescriptions Disp Refills   pantoprazole (PROTONIX) 40 MG tablet 90 tablet 0    Sig: Take 1 tablet (40 mg total) by mouth daily.     Gastroenterology: Proton Pump Inhibitors Passed - 10/31/2018  3:52 PM      Passed - Valid encounter within last 12 months    Recent Outpatient Visits          3 months ago Acute non-recurrent maxillary sinusitis   [facility], [place]   9 months ago Viral URI with cough   [facility], [place]   1 year ago [facility]al gastroenteritis   [facility]   1 year ago Viral sinusitis   [facility], [place]ine [place] C, [place]   1 year ago Viral URI with cough   [facility] [place], Maryjean MornPhilip H, MD

## 2019-08-06 ENCOUNTER — Other Ambulatory Visit: Payer: Self-pay

## 2019-08-06 ENCOUNTER — Emergency Department (HOSPITAL_COMMUNITY)
Admission: EM | Admit: 2019-08-06 | Discharge: 2019-08-06 | Disposition: A | Discharge status: Home / Self Care | Payer: No Typology Code available for payment source | Attending: Emergency Medicine | Admitting: Emergency Medicine

## 2019-08-06 ENCOUNTER — Emergency Department (HOSPITAL_COMMUNITY): Payer: No Typology Code available for payment source

## 2019-08-06 ENCOUNTER — Encounter (HOSPITAL_COMMUNITY): Payer: Self-pay | Admitting: Emergency Medicine

## 2019-08-06 DIAGNOSIS — Y999 Unspecified external cause status: Secondary | ICD-10-CM | POA: Diagnosis not present

## 2019-08-06 DIAGNOSIS — W208XXA Other cause of strike by thrown, projected or falling object, initial encounter: Secondary | ICD-10-CM | POA: Diagnosis not present

## 2019-08-06 DIAGNOSIS — Y929 Unspecified place or not applicable: Secondary | ICD-10-CM | POA: Diagnosis not present

## 2019-08-06 DIAGNOSIS — Z79899 Other long term (current) drug therapy: Secondary | ICD-10-CM | POA: Diagnosis not present

## 2019-08-06 DIAGNOSIS — Y939 Activity, unspecified: Secondary | ICD-10-CM | POA: Diagnosis not present

## 2019-08-06 DIAGNOSIS — S67191A Crushing injury of left index finger, initial encounter: Secondary | ICD-10-CM | POA: Diagnosis present

## 2019-08-06 DIAGNOSIS — S61211A Laceration without foreign body of left index finger without damage to nail, initial encounter: Secondary | ICD-10-CM | POA: Diagnosis not present

## 2019-08-06 MED ORDER — LIDOCAINE HCL 2 % IJ SOLN
10.0000 mL | Freq: Once | INTRAMUSCULAR | Status: AC
  Administered 2019-08-06: 10 mL via INTRADERMAL
  Filled 2019-08-06: qty 20

## 2019-08-06 NOTE — ED Provider Notes (Signed)
[facility] Provider Note   CSN: [id number] Arrival date & time: 08/06/19  0846     History   Chief Complaint Chief Complaint  Patient presents with  . Finger Injury    HPI Calvin Hood is a 38 y.o. male.     HPI   Patient is a 38 year old male with a history of GERD, kidney stones, rotator cuff tendinitis, vasovagal syncope who presents the emergency department today complaining of left index finger injury.  States Calvin Hood was moving packages prior to arrival when 1 of the boxes slipped and crushed his finger between the box and something metal.  Calvin Hood sustained a wound to the distal part of the finger.  States his pain is not very severe.  It worsens when Calvin Hood moves it.  The injury happened suddenly.  Calvin Hood was seen at occupational health prior to arrival and had Tdap updated.  Calvin Hood was sent here for further evaluation.  Past Medical History:  Diagnosis Date  . GERD (gastroesophageal reflux disease)   . Kidney stones   . Rotator cuff tendonitis    left  . Vasovagal syncope     Patient Active Problem List   Diagnosis Date Noted  . Vasovagal syncope   . GERD (gastroesophageal reflux disease)   . Maxillary sinusitis 10/05/2015  . Rotator cuff tendonitis 08/13/2014    Past Surgical History:  Procedure Laterality Date  . INGUINAL HERNIA REPAIR  1994   Left  . left knee surgery  1987   Cyst removal        Home Medications    Prior to Admission medications   Medication Sig Start Date End Date Taking? Authorizing Provider  amoxicillin-clavulanate (AUGMENTIN) 875-125 MG tablet Take 1 tablet by mouth 2 (two) times daily. Patient not taking: Reported on 08/13/2018 07/09/18   Waldon MerlMartin, William C, PA-C  fluticasone [facility](FLONASE) 50 MCG/ACT nasal spray Place 2 sprays into both nostrils daily. 02/12/17   Waldon MerlMartin, William C, PA-C  meloxicam (MOBIC) 15 MG tablet TAKE 1 TABLET BY MOUTH EVERY DAY 09/04/18   Asencion IslamStover, Titorya, DPM  pantoprazole (PROTONIX) 40 MG  tablet Take 1 tablet (40 mg total) by mouth daily. 10/31/18   Waldon MerlMartin, William C, PA-C  predniSONE (DELTASONE) 10 MG tablet Take as a tapering dose. 08/14/18   Asencion IslamStover, Titorya, DPM    Family History Family History  Problem Relation Age of Onset  . Heart attack Mother   . Kidney Stones Mother   . Throat cancer Father        +smoker (deceased)  . Healthy Brother   . Healthy Son   . Heart disease Maternal Grandfather     Social History Social History   Tobacco Use  . Smoking status: Never Smoker  . Smokeless tobacco: Never Used  Substance Use Topics  . Alcohol use: No  . Drug use: No     Allergies   Patient has no known allergies.   Review of Systems Review of Systems  Constitutional: Negative for fever.  Musculoskeletal:       Finger pain  Skin: Positive for wound.  Neurological: Negative for numbness.     Physical Exam Updated Vital Signs BP 117/79   Pulse 68   Temp 98.6 F (37 C) (Oral)   Resp 20   Ht 5\' 10"  (1.778 m)   Wt 79.4 kg   SpO2 100%   BMI 25.11 kg/m   Physical Exam Constitutional:      General: Calvin Hood is not in acute  distress.    Appearance: Calvin Hood is well-developed.  Eyes:     Conjunctiva/sclera: Conjunctivae normal.  Cardiovascular:     Rate and Rhythm: Normal rate.  Pulmonary:     Effort: Pulmonary effort is normal.     Breath sounds: Normal breath sounds.  Musculoskeletal:     Comments: TTP to the distal left index finger.  1.5 cm curved, slightly jagged laceration to the dorsum of the distal index finger.  This is just proximal to the nail and there is no nailbed involvement.  Sensation intact distally.  Brisk cap refill distally.  Range of motion intact at the DIP, PIP and MCP joints.  Skin:    General: Skin is warm and dry.  Neurological:     Mental Status: Calvin Hood is alert and oriented to person, place, and time.      ED Treatments / Results  Labs (all labs ordered are listed, but only abnormal results are displayed) Labs Reviewed - No  data to display  EKG None  Radiology Dg Finger Index Left  Result Date: 08/06/2019 CLINICAL DATA:  38 year old male with history of crush injury to the left index finger. EXAM: LEFT INDEX FINGER 2+V COMPARISON:  No priors. FINDINGS: Three views of the left second finger demonstrate dorsal soft tissue swelling overlying the DIP joint. No acute displaced fracture, subluxation or dislocation. IMPRESSION: 1. Soft tissue thickening dorsal to the left second DIP joint without underlying acute osseous abnormality. Electronically Signed   By: Trudie Reed M.D.   On: 08/06/2019 11:02    Procedures .Marland KitchenLaceration Repair  Date/Time: 08/06/2019 12:54 PM Performed by: Karrie Meres, PA-C Authorized by: Karrie Meres, PA-C   Consent:    Consent obtained:  Verbal   Consent given by:  Patient   Risks discussed:  Infection, pain, poor cosmetic result, retained foreign body and need for additional repair   Alternatives discussed:  No treatment Anesthesia (see MAR for exact dosages):    Anesthesia method:  Nerve block   Block needle gauge:  25 G   Block anesthetic:  Lidocaine 2% w/o epi   Block injection procedure:  Anatomic landmarks identified, introduced needle, incremental injection, negative aspiration for blood and anatomic landmarks palpated   Block outcome:  Anesthesia achieved Laceration details:    Location:  Finger   Finger location:  L index finger   Length (cm):  1.5 Repair type:    Repair type:  Simple Pre-procedure details:    Preparation:  Patient was prepped and draped in usual sterile fashion and imaging obtained to evaluate for foreign bodies Exploration:    Hemostasis achieved with:  Direct pressure   Wound exploration: wound explored through full range of motion and entire depth of wound probed and visualized     Wound extent: no foreign bodies/material noted, no tendon damage noted, no underlying fracture noted and no vascular damage noted     Contaminated: no    Treatment:    Area cleansed with:  Soap and water and saline   Amount of cleaning:  Standard   Irrigation solution:  Sterile saline   Irrigation method:  Pressure wash   Visualized foreign bodies/material removed: no   Skin repair:    Repair method:  Sutures   Suture size:  5-0   Suture material:  Prolene   Suture technique:  Simple interrupted   Number of sutures:  4 Approximation:    Approximation:  Close Post-procedure details:    Dressing:  Adhesive bandage and splint for  protection   Patient tolerance of procedure:  Tolerated well, no immediate complications   (including critical care time)  Medications Ordered in ED Medications  lidocaine (XYLOCAINE) 2 % (with pres) injection 200 mg (10 mLs Intradermal Given 08/06/19 1135)     Initial Impression / Assessment and Plan / ED Course  I have reviewed the triage vital signs and the nursing notes.  Pertinent labs & imaging results that were available during my care of the patient were reviewed by me and considered in my medical decision making (see chart for details).    Final Clinical Impressions(s) / ED Diagnoses   Final diagnoses:  Laceration of left index finger without foreign body without damage to nail, initial encounter   38 year old presenting for left index finger laceration.  X-ray of the left finger does not show any bony involvement.  Tdap was updated at occupational health prior to arrival.  Pressure irrigation performed. Wound explored and base of wound visualized in a bloodless field without evidence of foreign body.  Pt has no comorbidities to effect normal wound healing. Pt discharged without antibiotics.  Discussed suture home care with patient and answered questions. Pt to follow-up for wound check and suture removal in 10-14 days; they are to return to the ED sooner for signs of infection. Pt is hemodynamically stable with no complaints prior to dc.    ED Discharge Orders    None       Bishop Dublin 08/06/19 1257    Charlesetta Shanks, MD 08/08/19 1151

## 2019-08-06 NOTE — Discharge Instructions (Addendum)
Please follow-up for suture removal at either urgent care ,the emergency department, or your primary care doctor in 10-14 days.  Please return to the emergency room immediately if you experience any new or worsening symptoms or any symptoms that indicate worsening infection such as fevers, increased redness/swelling/pain, warmth, or drainage from the affected area.   

## 2019-08-06 NOTE — ED Notes (Signed)
Transported to xray 

## 2019-08-06 NOTE — ED Triage Notes (Signed)
Pt states he cut his left index finger on a package this morning around 2 am. Bleeding controlled.

## 2019-08-20 ENCOUNTER — Ambulatory Visit (INDEPENDENT_AMBULATORY_CARE_PROVIDER_SITE_OTHER): Payer: BC Managed Care – PPO | Admitting: Physician Assistant

## 2019-08-20 ENCOUNTER — Encounter: Payer: Self-pay | Admitting: Physician Assistant

## 2019-08-20 ENCOUNTER — Other Ambulatory Visit: Payer: Self-pay

## 2019-08-20 DIAGNOSIS — B9789 Other viral agents as the cause of diseases classified elsewhere: Secondary | ICD-10-CM

## 2019-08-20 DIAGNOSIS — J019 Acute sinusitis, unspecified: Secondary | ICD-10-CM | POA: Diagnosis not present

## 2019-08-20 NOTE — Progress Notes (Signed)
I have discussed the procedure for the virtual visit with the patient who has given consent to proceed with assessment and treatment.   Erikson Danzy S Neville Walston, CMA     

## 2019-08-20 NOTE — Patient Instructions (Signed)
We are sorry that you are not feeling well.  Here is how we plan to help!  Based on what you have shared with me it looks like you have sinusitis.  Sinusitis is inflammation and infection in the sinus cavities of the head.  Based on your presentation I believe you most likely have Acute Viral Sinusitis.This is an infection most likely caused by a virus. There is not specific treatment for viral sinusitis other than to help you with the symptoms until the infection runs its course. Saline nasal spray help and can safely be used as often as needed for congestion. Continue your Flonase and start a Zyrtec-D.   Some authorities believe that zinc sprays or the use of Echinacea may shorten the course of your symptoms.  Sinus infections are not as easily transmitted as other respiratory infection, however we still recommend that you avoid close contact with loved ones, especially the very young and elderly.  Remember to wash your hands thoroughly throughout the day as this is the number one way to prevent the spread of infection!  Home Care:  Only take medications as instructed by your medical team.  Do not take these medications with alcohol.  A steam or ultrasonic humidifier can help congestion.  You can place a towel over your head and breathe in the steam from hot water coming from a faucet.  Avoid close contacts especially the very young and the elderly.  Cover your mouth when you cough or sneeze.  Always remember to wash your hands.  Get Help Right Away If:  You develop worsening fever or sinus pain.  You develop a severe head ache or visual changes.  Your symptoms persist after you have completed your treatment plan.  Make sure you  Understand these instructions.  Will watch your condition.  Will get help right away if you are not doing well or get worse.

## 2019-08-20 NOTE — Progress Notes (Signed)
   Virtual Visit via Video   I connected with patient on 08/20/19 at  2:00 PM EDT by a video enabled telemedicine application and verified that I am speaking with the correct person using two identifiers.  Location patient: Home Location provider: [facility], Office Persons participating in the virtual visit: Patient, Provider, [place] (Patina Moore)  I discussed the limitations of evaluation and management by telemedicine and the availability of in person appointments. The patient expressed understanding and agreed to proceed.  Subjective:   HPI:   Patient presents via [employer] today complaining of nasal congestion, sore throat, sinus pressure starting this past Sunday.  Patient endorses waking up Sunday with dry, scratchy throat and dry cough.  Denies fever, chills or aches. Denies recent travel or sick contact. Denies known COVID exposure. Denies change to taste or smell.  Patient has taken Flonase and an over-the-counter decongestant for symptoms, which has been helping some. Notes today feeling better compared to yesterday. Denies ear pain or tooth pain. Notes some headache from pressure.  ROS:   See pertinent positives and negatives per HPI.  Patient Active Problem List   Diagnosis Date Noted  . Vasovagal syncope   . GERD (gastroesophageal reflux disease)   . Maxillary sinusitis 10/05/2015  . Rotator cuff tendonitis 08/13/2014    Social History   Tobacco Use  . Smoking status: Never Smoker  . Smokeless tobacco: Never Used  Substance Use Topics  . Alcohol use: No    Current Outpatient Medications:  .  fluticasone (FLONASE) 50 MCG/ACT nasal spray, Place 2 sprays into both nostrils daily., Disp: 16 g, Rfl: 0 .  meloxicam (MOBIC) 15 MG tablet, TAKE 1 TABLET BY MOUTH EVERY DAY, Disp: 30 tablet, Rfl: 0 .  pantoprazole (PROTONIX) 40 MG tablet, Take 1 tablet (40 mg total) by mouth daily., Disp: 90 tablet, Rfl: 0 .  predniSONE (DELTASONE) 10 MG tablet, Take as a tapering  dose., Disp: 21 tablet, Rfl: 0  No Known Allergies  Objective:   There were no vitals taken for this visit.  Patient is well-developed, well-nourished in no acute distress.  Resting comfortably at home.  Head is normocephalic, atraumatic.  No labored breathing.  Speech is clear and coherent with logical content.  Patient is alert and oriented at baseline.  No TTP of sinuses on exam.   Assessment and Plan:   1. Acute viral sinusitis Improving over past 24 hours. Does not seem consistent with COVID. Increase fluids.  Rest.  Saline nasal spray.  Probiotic.  Mucinex as directed.  Humidifier in bedroom. Restart combination antihistamine and decongestant.  Call or return to clinic if symptoms are not improving.   Leeanne Rio, PA-C 08/20/2019

## 2019-08-22 ENCOUNTER — Telehealth: Payer: Self-pay | Admitting: Physician Assistant

## 2019-08-22 MED ORDER — AMOXICILLIN-POT CLAVULANATE 875-125 MG PO TABS: 1.0000 | ORAL_TABLET | Freq: Two times a day (BID) | ORAL | 0 refills | Status: DC

## 2019-08-22 NOTE — Telephone Encounter (Signed)
Continue care discussed at visit. I have sent in Rx for Augmentin to take as directed. Follow-up if not resolving.

## 2019-08-22 NOTE — Telephone Encounter (Signed)
Pt's wife made aware of this.  °

## 2019-08-22 NOTE — Telephone Encounter (Signed)
Tiffany called in stating that pt has been doing the OTC meds that [facility] reccommended he do. He is not improving per Tiffany. He is still having a lot of sinus pressure and is coughing more. She wanted to know if something could be [facility] [place]. Please call Tiffany at [phone].

## 2019-10-02 IMAGING — DX DG FINGER INDEX 2+V*L*
3 series · 3 of 3 positions shown · non-contrast
Comparison: No priors.

CLINICAL DATA: 37-year-old male with history of crush injury to the
left index finger.

EXAM:
LEFT INDEX FINGER 2+V

[finger ap]
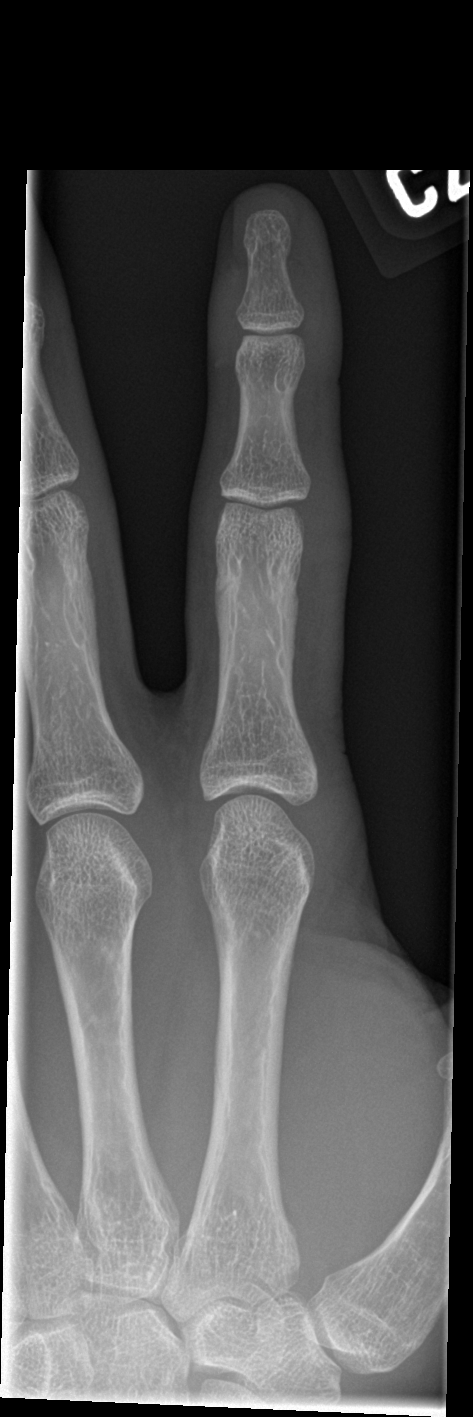

[finger obl]
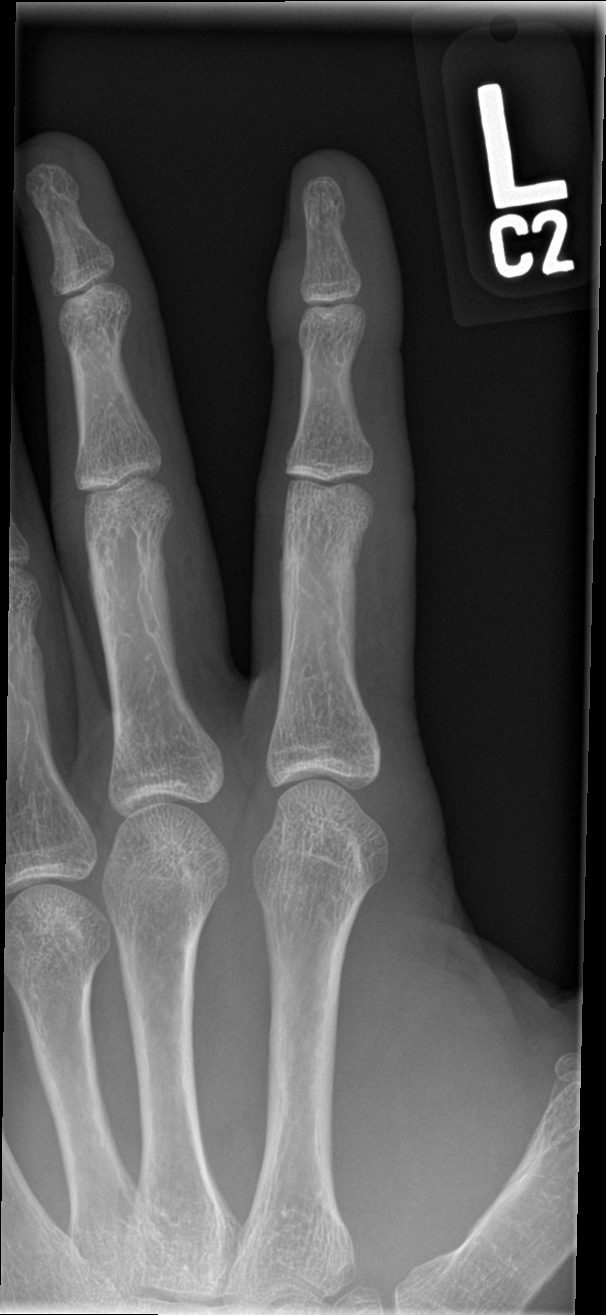

[finger lat]
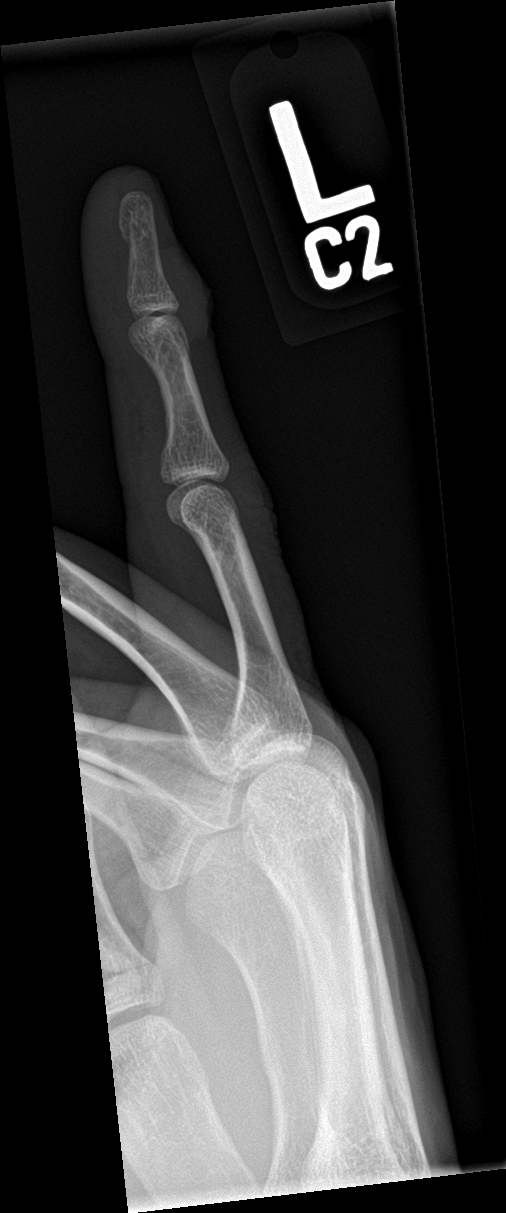

[3 of 3 positions shown; findings below may reference images not displayed]

FINDINGS: Three views of the left second finger demonstrate dorsal soft tissue
swelling overlying the DIP joint. No acute displaced fracture,
subluxation or dislocation.
IMPRESSION: 1. Soft tissue thickening dorsal to the left second DIP joint
without underlying acute osseous abnormality.

## 2020-02-06 ENCOUNTER — Telehealth: Payer: Self-pay | Admitting: Emergency Medicine

## 2020-02-06 NOTE — Telephone Encounter (Signed)
Patient wife sent a my chart message for patient to restart his Protonix medication. Unsure if patient is having symptoms again. Sent a message thru patient wife for response of medication. Medication is not on patient current list of medications. Patient is due for a CPE as well

## 2020-10-04 ENCOUNTER — Emergency Department (HOSPITAL_COMMUNITY)
Admission: EM | Admit: 2020-10-04 | Discharge: 2020-10-04 | Disposition: A | Discharge status: Home / Self Care | Payer: BC Managed Care – PPO | Attending: Emergency Medicine | Admitting: Emergency Medicine

## 2020-10-04 ENCOUNTER — Encounter (HOSPITAL_COMMUNITY): Payer: Self-pay

## 2020-10-04 ENCOUNTER — Other Ambulatory Visit: Payer: Self-pay

## 2020-10-04 DIAGNOSIS — M25532 Pain in left wrist: Secondary | ICD-10-CM | POA: Diagnosis not present

## 2020-10-04 DIAGNOSIS — R55 Syncope and collapse: Secondary | ICD-10-CM | POA: Insufficient documentation

## 2020-10-04 LAB — CBG MONITORING, ED: Glucose-Capillary: 89 mg/dL (ref 70–99)

## 2020-10-04 LAB — BASIC METABOLIC PANEL
Anion gap: 10 (ref 5–15)
BUN: 12 mg/dL (ref 6–20)
CO2: 24 mmol/L (ref 22–32)
Calcium: 9.3 mg/dL (ref 8.9–10.3)
Chloride: 104 mmol/L (ref 98–111)
Creatinine, Ser: 1.35 mg/dL — ABNORMAL HIGH (ref 0.61–1.24)
GFR, Estimated: >60 mL/min (ref >60)
Glucose, Bld: 112 mg/dL — ABNORMAL HIGH (ref 70–99)
Potassium: 3.8 mmol/L (ref 3.5–5.1)
Sodium: 138 mmol/L (ref 135–145)

## 2020-10-04 LAB — CBC
HCT: 46.9 % (ref 39.0–52.0)
Hemoglobin: 16.2 g/dL (ref 13.0–17.0)
MCH: 30.5 pg (ref 26.0–34.0)
MCHC: 34.5 g/dL (ref 30.0–36.0)
MCV: 88.2 fL (ref 80.0–100.0)
Platelets: 198 10*3/uL (ref 150–400)
RBC: 5.32 MIL/uL (ref 4.22–5.81)
RDW: 12.6 % (ref 11.5–15.5)
WBC: 5.2 10*3/uL (ref 4.0–10.5)
nRBC: 0 % (ref 0.0–0.2)

## 2020-10-04 NOTE — ED Notes (Signed)
Pt given food per MD and will be ambulated 30 minutes after consumption

## 2020-10-04 NOTE — ED Provider Notes (Signed)
[facility] EMERGENCY DEPARTMENT Provider Note   CSN: [id number] Arrival date & time: 10/04/20  0249     History Chief Complaint  Patient presents with  . Near Syncope    Calvin Hood is a 39 y.o. male.  Patient was leaning on his left wrist in a dorsiflexed position while defecating and started having some pain in his left wrist during this time.  He went to sleep and woke up and when he walked around a little bit he got lightheaded nauseous and clammy thought he is going to pass out.  This associate with his wrist pain is concern for cardiac issues as his family has a history of cardiac disease he presents here for further evaluation.  Patient without any persistent shortness of breath, nausea, vomiting, diaphoresis.  At no point did have any chest pain or shortness of breath.  No fevers.  No cough.  The wrist pain seems to be improving.  Has no radiation.  The history is provided by the patient and the spouse.  Near Syncope This is a new problem. The current episode started 1 to 2 hours ago. The problem occurs constantly. The problem has been gradually improving. Pertinent negatives include no chest pain, no abdominal pain, no headaches and no shortness of breath.       Past Medical History:  Diagnosis Date  . GERD (gastroesophageal reflux disease)   . Kidney stones   . Rotator cuff tendonitis    left  . Vasovagal syncope     Patient Active Problem List   Diagnosis Date Noted  . Vasovagal syncope   . GERD (gastroesophageal reflux disease)   . Maxillary sinusitis 10/05/2015  . Rotator cuff tendonitis 08/13/2014    Past Surgical History:  Procedure Laterality Date  . INGUINAL HERNIA REPAIR  1994   Left  . left knee surgery  1987   Cyst removal       Family History  Problem Relation Age of Onset  . Heart attack Mother   . Kidney Stones Mother   . Throat cancer Father        +smoker (deceased)  . Healthy Brother   . Healthy Son   . Heart  disease Maternal Grandfather     Social History   Tobacco Use  . Smoking status: Never Smoker  . Smokeless tobacco: Never Used  Vaping Use  . Vaping Use: Never used  Substance Use Topics  . Alcohol use: No  . Drug use: No    Home Medications Prior to Admission medications   Not on File    Allergies    Patient has no known allergies.  Review of Systems   Review of Systems  Respiratory: Negative for shortness of breath.   Cardiovascular: Positive for near-syncope. Negative for chest pain.  Gastrointestinal: Negative for abdominal pain.  Neurological: Negative for headaches.  All other systems reviewed and are negative.   Physical Exam Updated Vital Signs BP 119/86 (BP Location: Left Arm)   Pulse 84   Temp 98.2 F (36.8 C) (Oral)   Resp 18   Ht 5\' 10"  (1.778 m)   Wt 79.4 kg   SpO2 100%   BMI 25.11 kg/m   Physical Exam Vitals and nursing note reviewed.  Constitutional:      Appearance: He is well-developed.  HENT:     Head: Normocephalic and atraumatic.     Nose: No congestion or rhinorrhea.     Mouth/Throat:     Mouth: Mucous membranes  are moist.     Pharynx: Oropharynx is clear.  Eyes:     Pupils: Pupils are equal, round, and reactive to light.  Cardiovascular:     Rate and Rhythm: Normal rate.  Pulmonary:     Effort: Pulmonary effort is normal. No respiratory distress.  Abdominal:     General: Abdomen is flat. There is no distension.  Musculoskeletal:        General: Tenderness (with ROM to left lateral volar wrist area) present. Normal range of motion.     Cervical back: Normal range of motion.  Skin:    General: Skin is warm and dry.  Neurological:     General: No focal deficit present.     Mental Status: He is alert.     ED Results / Procedures / Treatments   Labs (all labs ordered are listed, but only abnormal results are displayed) Labs Reviewed  BASIC METABOLIC PANEL - Abnormal; Notable for the following components:      Result Value    Glucose, Bld 112 (*)    Creatinine, Ser 1.35 (*)    All other components within normal limits  CBC  URINALYSIS, ROUTINE W REFLEX MICROSCOPIC  CBG MONITORING, ED    EKG EKG Interpretation  Date/Time:  Monday October 04 2020 02:57:55 EST Ventricular Rate:  70 PR Interval:  174 QRS Duration: 76 QT Interval:  386 QTC Calculation: 416 R Axis:   68 Text Interpretation: Normal sinus rhythm Nonspecific T wave abnormality Abnormal ECG No significant change since last tracing in 2002 Confirmed by Marily Memos [phone]) on 10/04/2020 4:52:59 AM   Radiology No results found.  Procedures Procedures (including critical care time)  Medications Ordered in ED Medications - No data to display  ED Course  I have reviewed the triage vital signs and the nursing notes.  Pertinent labs & imaging results that were available during my care of the patient were reviewed by me and considered in my medical decision making (see chart for details).    MDM Rules/Calculators/A&P                          Suspect joint pain from prolonged dorsiflexion with weight and an orthostatic episode. ECg unchanged from previous almost 20 years ago, low likelihood for ACS. Also doubt electrolyte issues. Possibly slightly dehydrated. Has mild CKD unchanged.   Final Clinical Impression(s) / ED Diagnoses Final diagnoses:  Near syncope  Left wrist pain    Rx / DC Orders ED Discharge Orders    None       Talib Headley, Barbara Cower, MD 10/04/20 [phone]

## 2020-10-04 NOTE — ED Triage Notes (Signed)
Pt states that he had a near syncopal episode this evening
